# Patient Record
Sex: Male | Born: 1970 | Race: Asian | Hispanic: No | Marital: Married | Smoking: Never smoker
Health system: Southern US, Community
[De-identification: ages and names within clinical notes are randomized; demographics above are authoritative.]

## PROBLEM LIST (undated history)

## (undated) DIAGNOSIS — E119 Type 2 diabetes mellitus without complications: Secondary | ICD-10-CM

## (undated) DIAGNOSIS — E785 Hyperlipidemia, unspecified: Secondary | ICD-10-CM

## (undated) DIAGNOSIS — I209 Angina pectoris, unspecified: Secondary | ICD-10-CM

## (undated) DIAGNOSIS — I2510 Atherosclerotic heart disease of native coronary artery without angina pectoris: Secondary | ICD-10-CM

## (undated) DIAGNOSIS — I10 Essential (primary) hypertension: Secondary | ICD-10-CM

## (undated) HISTORY — DX: Atherosclerotic heart disease of native coronary artery without angina pectoris: I25.10

## (undated) HISTORY — PX: OTHER SURGICAL HISTORY: SHX169

---

## 2018-01-22 ENCOUNTER — Other Ambulatory Visit: Payer: Self-pay | Admitting: Cardiology

## 2018-01-22 DIAGNOSIS — I209 Angina pectoris, unspecified: Secondary | ICD-10-CM

## 2018-01-31 ENCOUNTER — Ambulatory Visit (HOSPITAL_COMMUNITY)
Admission: RE | Admit: 2018-01-31 | Discharge: 2018-01-31 | Disposition: A | Discharge status: Home / Self Care | Payer: Managed Care, Other (non HMO) | Source: Ambulatory Visit | Attending: Cardiology | Admitting: Cardiology

## 2018-01-31 ENCOUNTER — Encounter: Payer: Managed Care, Other (non HMO) | Admitting: *Deleted

## 2018-01-31 ENCOUNTER — Encounter (HOSPITAL_COMMUNITY): Payer: Self-pay

## 2018-01-31 DIAGNOSIS — I209 Angina pectoris, unspecified: Secondary | ICD-10-CM | POA: Diagnosis present

## 2018-01-31 DIAGNOSIS — K760 Fatty (change of) liver, not elsewhere classified: Secondary | ICD-10-CM | POA: Diagnosis not present

## 2018-01-31 DIAGNOSIS — I2510 Atherosclerotic heart disease of native coronary artery without angina pectoris: Secondary | ICD-10-CM

## 2018-01-31 DIAGNOSIS — I25119 Atherosclerotic heart disease of native coronary artery with unspecified angina pectoris: Secondary | ICD-10-CM | POA: Insufficient documentation

## 2018-01-31 DIAGNOSIS — Z006 Encounter for examination for normal comparison and control in clinical research program: Secondary | ICD-10-CM

## 2018-01-31 DIAGNOSIS — R079 Chest pain, unspecified: Secondary | ICD-10-CM

## 2018-01-31 HISTORY — DX: Angina pectoris, unspecified: I20.9

## 2018-01-31 HISTORY — DX: Hyperlipidemia, unspecified: E78.5

## 2018-01-31 HISTORY — DX: Essential (primary) hypertension: I10

## 2018-01-31 HISTORY — DX: Type 2 diabetes mellitus without complications: E11.9

## 2018-01-31 LAB — POCT I-STAT CREATININE: Creatinine, Ser: 0.7 mg/dL (ref 0.61–1.24)

## 2018-01-31 IMAGING — CT CT HEART MORP W/ CTA COR W/ SCORE W/ CA W/CM &/OR W/O CM
4 of 7 series · 8 of 20 positions shown, 9 images · IV contrast (APPLIED)
Comparison: None.

Addendum:
EXAM:
OVER-READ INTERPRETATION  CT CHEST

The following report is an over-read performed by radiologist Dr.
Beatrix Right [REDACTED] on 01/31/2018. This
over-read does not include interpretation of cardiac or coronary
anatomy or pathology. The coronary calcium score/coronary CTA
interpretation by the cardiologist is attached.
CLINICAL DATA: Chest pain
Cardiac CTA
MEDICATIONS:
Sub lingual nitro. 4mg x 2
TECHNIQUE: The patient was scanned on a Siemens [REDACTED]ice scanner. Gantry
rotation speed was 250 msecs. Collimation was 0.6 mm. A 100 kV
prospective scan was triggered in the ascending thoracic aorta at
35-75% of the R-R interval. Average HR during the scan was 60 bpm.
The 3D data set was interpreted on a dedicated work station using
MPR, MIP and VRT modes. A total of 80cc of contrast was used.

[Series 6: best diast 75 % · axial · 0.37mm/px · z∈[+1112,+1158]mm · 2 of 346 slices shown, 3 images]
[im 116/346  vessel]
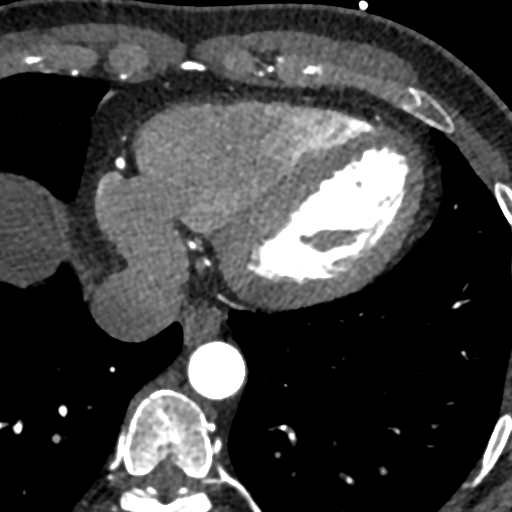
[im 116/346  lung]
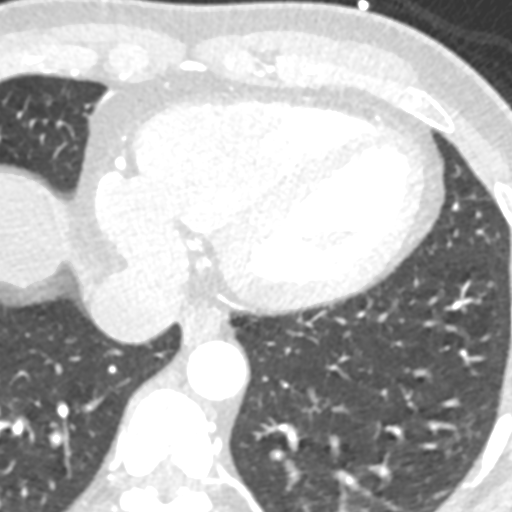
[im 231/346  vessel]
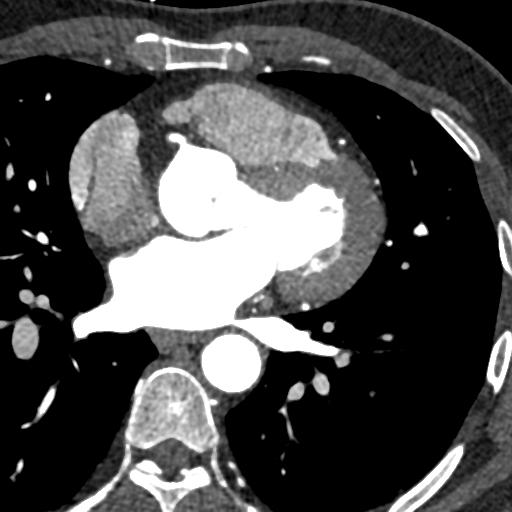

[Series 7: best syst 32 % · axial · 0.37mm/px · z∈[+1112,+1158]mm · 2 of 346 slices shown]
[im 116/346  vessel]
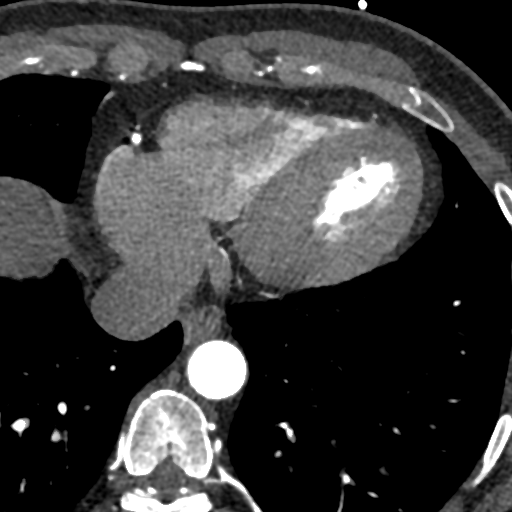
[im 231/346  vessel]
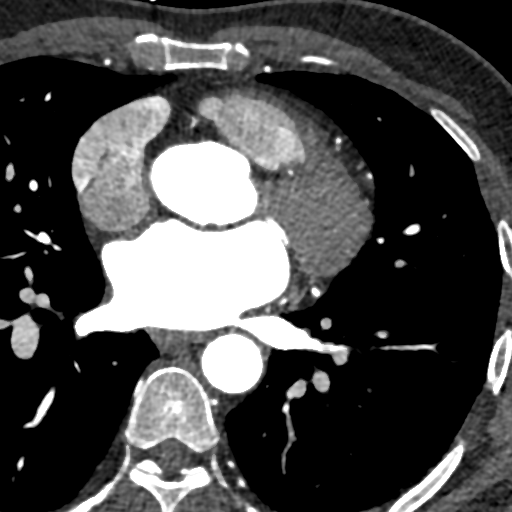

[Series 8: ts diast sharp 75 % · axial · 0.37mm/px · z∈[+1112,+1158]mm · 2 of 346 slices shown]
[im 116/346  lung]
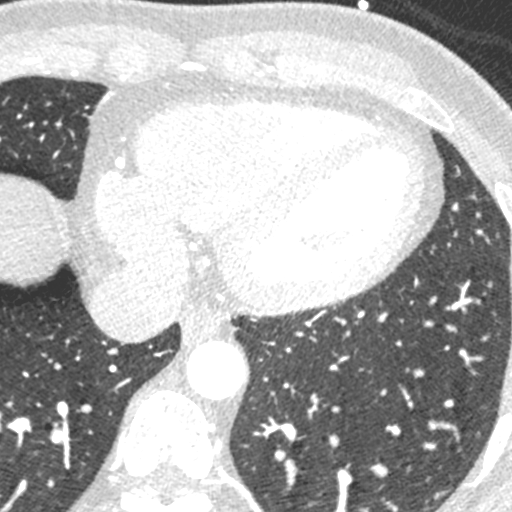
[im 231/346  lung]
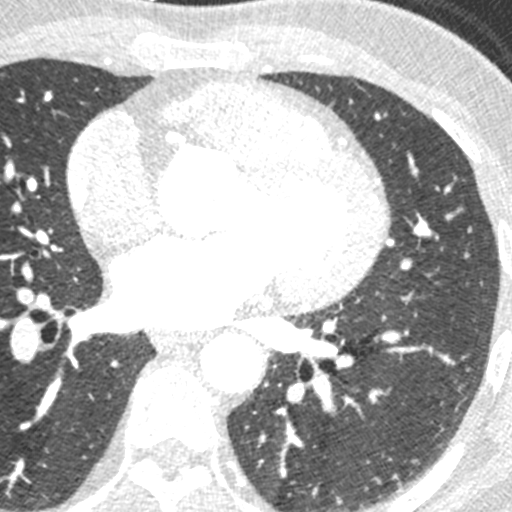

[Series 9: ts syst sharp 32 % · axial · 0.37mm/px · z∈[+1112,+1158]mm · 2 of 346 slices shown]
[im 116/346  lung]
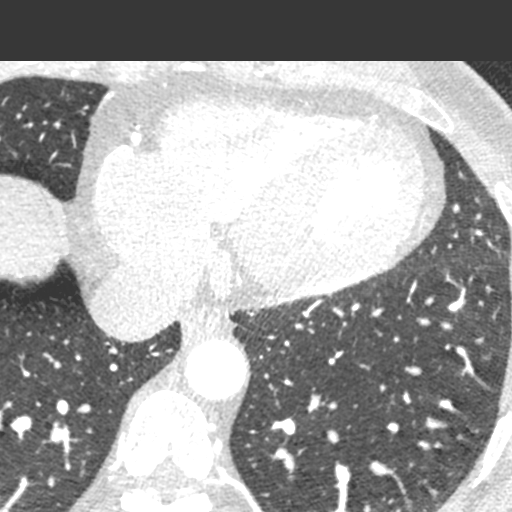
[im 231/346  lung]
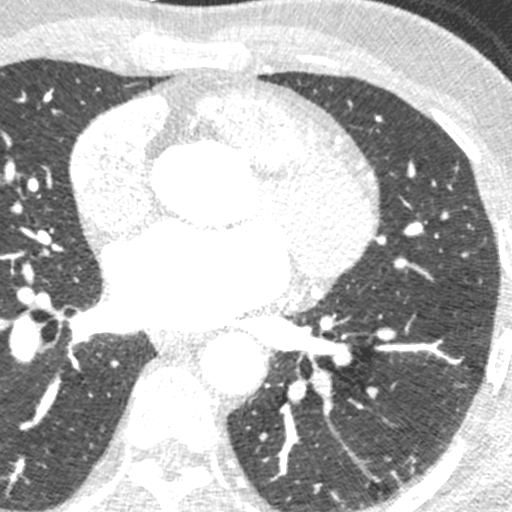

[8 of 20 positions shown; findings below may reference images not displayed]

FINDINGS: Within the visualized portions of the thorax there are no suspicious
appearing pulmonary nodules or masses, there is no acute
consolidative airspace disease, no pleural effusions, no
pneumothorax and no lymphadenopathy. Visualized portions of the
upper abdomen demonstrates diffuse low attenuation throughout the
visualized hepatic parenchyma, indicative of hepatic steatosis.
There are no aggressive appearing lytic or blastic lesions noted in
the visualized portions of the skeleton.
IMPRESSION: 1. Hepatic steatosis.
FINDINGS: Non-cardiac: See separate report from [REDACTED].

Pulmonary veins drained normally to the left atrium.

Calcium Score: 13.2 Agatston units.

Coronary Arteries: Right dominant with no anomalies

LM: No plaque or stenosis.

LAD system: There is mixed plaque with severe (>90%) stenosis in the
proximal LAD.

Circumflex system: No plaque or stenosis.

RCA system:  No plaque or stenosis.
IMPRESSION: 1. Coronary artery calcium score 13.2 Agatston units. This places
the patient in the 84th percentile for age and gender.

2. There appears to be severe (>90%) proximal LAD stenosis. Will
send FFR to confirm.

Sopha Yakar

*** End of Addendum ***

## 2018-01-31 MED ORDER — NITROGLYCERIN 0.4 MG SL SUBL
0.4000 mg | SUBLINGUAL_TABLET | SUBLINGUAL | Status: DC | PRN
  Administered 2018-01-31: 0.8 mg via SUBLINGUAL
  Filled 2018-01-31: qty 25

## 2018-01-31 MED ORDER — METOPROLOL TARTRATE 5 MG/5ML IV SOLN
5.0000 mg | INTRAVENOUS | Status: DC | PRN
  Administered 2018-01-31: 5 mg via INTRAVENOUS
  Filled 2018-01-31: qty 5

## 2018-01-31 MED ORDER — METOPROLOL TARTRATE 5 MG/5ML IV SOLN
INTRAVENOUS | Status: AC
  Administered 2018-01-31: 5 mg via INTRAVENOUS
  Filled 2018-01-31: qty 10

## 2018-01-31 MED ORDER — NITROGLYCERIN 0.4 MG SL SUBL
SUBLINGUAL_TABLET | SUBLINGUAL | Status: AC
  Administered 2018-01-31: 0.8 mg via SUBLINGUAL
  Filled 2018-01-31: qty 1

## 2018-01-31 MED ORDER — IOPAMIDOL (ISOVUE-370) INJECTION 76%
100.0000 mL | Freq: Once | INTRAVENOUS | Status: AC | PRN
  Administered 2018-01-31: 100 mL via INTRAVENOUS

## 2018-01-31 NOTE — Research (Signed)
CADFEM Informed Consent                  Subject Name:   Peter Warren   Subject met inclusion and exclusion criteria.  The informed consent form, study requirements and expectations were reviewed with the subject and questions and concerns were addressed prior to the signing of the consent form.  The subject verbalized understanding of the trial requirements.  The subject agreed to participate in the CADFEM trial and signed the informed consent.  The informed consent was obtained prior to performance of any protocol-specific procedures for the subject.  A copy of the signed informed consent was given to the subject and a copy was placed in the subject's medical record.   [place] Chalmers, Research Assistant 01/31/2018     11:48 a.m.

## 2018-02-03 ENCOUNTER — Ambulatory Visit (HOSPITAL_COMMUNITY)
Admission: RE | Admit: 2018-02-03 | Discharge: 2018-02-03 | Disposition: A | Discharge status: Home / Self Care | Payer: Managed Care, Other (non HMO) | Source: Ambulatory Visit | Attending: Cardiology | Admitting: Cardiology

## 2018-02-03 DIAGNOSIS — I2510 Atherosclerotic heart disease of native coronary artery without angina pectoris: Secondary | ICD-10-CM | POA: Diagnosis not present

## 2018-05-13 ENCOUNTER — Other Ambulatory Visit (HOSPITAL_COMMUNITY): Payer: Self-pay | Admitting: Cardiology

## 2018-05-14 LAB — LIPID PANEL W/O CHOL/HDL RATIO
Cholesterol, Total: 76 mg/dL — ABNORMAL LOW (ref 100–199)
HDL: 26 mg/dL — ABNORMAL LOW (ref >39)
LDL Calculated: 27 mg/dL (ref 0–99)
Triglycerides: 113 mg/dL (ref 0–149)
VLDL Cholesterol Cal: 23 mg/dL (ref 5–40)

## 2018-05-14 LAB — HGB A1C W/O EAG: Hgb A1c MFr Bld: 5.3 % (ref 4.8–5.6)

## 2018-05-14 LAB — COMPREHENSIVE METABOLIC PANEL
ALBUMIN: 5 g/dL (ref 4.0–5.0)
ALT: 32 IU/L (ref 0–44)
AST: 26 IU/L (ref 0–40)
Albumin/Globulin Ratio: 2.4 — ABNORMAL HIGH (ref 1.2–2.2)
Alkaline Phosphatase: 89 IU/L (ref 39–117)
BILIRUBIN TOTAL: 1 mg/dL (ref 0.0–1.2)
BUN / CREAT RATIO: 11 (ref 9–20)
BUN: 9 mg/dL (ref 6–24)
CALCIUM: 9.2 mg/dL (ref 8.7–10.2)
CHLORIDE: 105 mmol/L (ref 96–106)
CO2: 22 mmol/L (ref 20–29)
Creatinine, Ser: 0.82 mg/dL (ref 0.76–1.27)
GFR calc non Af Amer: 107 mL/min/{1.73_m2} (ref >59)
GFR, EST AFRICAN AMERICAN: 123 mL/min/{1.73_m2} (ref >59)
GLUCOSE: 93 mg/dL (ref 65–99)
Globulin, Total: 2.1 g/dL (ref 1.5–4.5)
Potassium: 4.3 mmol/L (ref 3.5–5.2)
Sodium: 142 mmol/L (ref 134–144)
TOTAL PROTEIN: 7.1 g/dL (ref 6.0–8.5)

## 2018-05-16 ENCOUNTER — Ambulatory Visit (INDEPENDENT_AMBULATORY_CARE_PROVIDER_SITE_OTHER): Payer: Managed Care, Other (non HMO) | Admitting: Cardiology

## 2018-05-16 ENCOUNTER — Encounter: Payer: Self-pay | Admitting: Cardiology

## 2018-05-16 ENCOUNTER — Encounter (INDEPENDENT_AMBULATORY_CARE_PROVIDER_SITE_OTHER): Payer: Self-pay

## 2018-05-16 VITALS — BP 102/70 | HR 68 | Ht 66.0 in | Wt 134.9 lb

## 2018-05-16 DIAGNOSIS — E119 Type 2 diabetes mellitus without complications: Secondary | ICD-10-CM | POA: Diagnosis not present

## 2018-05-16 DIAGNOSIS — E782 Mixed hyperlipidemia: Secondary | ICD-10-CM

## 2018-05-16 DIAGNOSIS — I2510 Atherosclerotic heart disease of native coronary artery without angina pectoris: Secondary | ICD-10-CM

## 2018-05-16 MED ORDER — JARDIANCE 10 MG PO TABS: 5.0000 mg | ORAL_TABLET | Freq: Every day | ORAL | 3 refills | Status: DC

## 2018-05-16 NOTE — Progress Notes (Signed)
Subjective:   Peter Warren, male    DOB: 1970/09/01, 48 y.o.   MRN: [id number]   Chief Complaint  Patient presents with  . Hyperlipidemia    2 momnth f/u labs    HPI  48 year old Asian [place] man with type II diabetes mellitus, hypertriglyceridemia, single vessel coronary artery disease with ostial/prox LAD > 90% stenosis, h/o elevated liver enzymes here for follow-up.  After discussing the risks/benefits/ and alternative options of invasive therapy with stenting + optimal medical therapy versus optimal medical therapy alone, patient had opted for medical therapy alone.  Patient has made significant changes to his diet and lifestyle in the past 3 months. He has been on optimal medical therapy. His TG have dropped by >60%. HbA1C has decreased from 8.3% to 5.3%.  Liver enzymes have normalized.  He remains cautious with his physical activity, but is walking up to 35 minutes at a slow speed without any chest pain or shortness of breath symptoms.  He has also started taking stairs at work without any symptoms.   Results for Peter Warren (MRN [id number]) as of 05/16/2018 08:50  Ref. Range 05/13/2018 08:47  COMPREHENSIVE METABOLIC PANEL Unknown Rpt (A)  Sodium Latest Ref Range: 134 - 144 mmol/L 142  Potassium Latest Ref Range: 3.5 - 5.2 mmol/L 4.3  Chloride Latest Ref Range: 96 - 106 mmol/L 105  CO2 Latest Ref Range: 20 - 29 mmol/L 22  Glucose Latest Ref Range: 65 - 99 mg/dL 93  BUN Latest Ref Range: 6 - 24 mg/dL 9  Creatinine Latest Ref Range: 0.76 - 1.27 mg/dL 7.82  Calcium Latest Ref Range: 8.7 - 10.2 mg/dL 9.2  BUN/Creatinine Ratio Latest Ref Range: 9 - 20  11  Alkaline Phosphatase Latest Ref Range: 39 - 117 IU/L 89  Albumin Latest Ref Range: 4.0 - 5.0 g/dL 5.0  Albumin/Globulin Ratio Latest Ref Range: 1.2 - 2.2  2.4 (H)  AST Latest Ref Range: 0 - 40 IU/L 26  ALT Latest Ref Range: 0 - 44 IU/L 32  Total Protein Latest Ref Range: 6.0 - 8.5 g/dL 7.1  Total Bilirubin Latest Ref  Range: 0.0 - 1.2 mg/dL 1.0  GFR, Est Non African American Latest Ref Range: >59 mL/min/1.73 107  GFR, Est African American Latest Ref Range: >59 mL/min/1.73 123  Cholesterol, Total Latest Ref Range: 100 - 199 mg/dL 76 (L)  HDL Cholesterol Latest Ref Range: >39 mg/dL 26 (L)  LDL (calc) Latest Ref Range: 0 - 99 mg/dL 27  Triglycerides Latest Ref Range: 0 - 149 mg/dL 956  VLDL Cholesterol Cal Latest Ref Range: 5 - 40 mg/dL 23  Globulin, Total Latest Ref Range: 1.5 - 4.5 g/dL 2.1   Results for Peter Warren (MRN [id number]) as of 05/16/2018 08:50  Ref. Range 05/13/2018 08:47  Glucose Latest Ref Range: 65 - 99 mg/dL 93  Hemoglobin I6N Latest Ref Range: 4.8 - 5.6 % 5.3    Past Medical History:  Diagnosis Date  . Angina pectoris (HCC)   . Coronary artery disease   . Diabetes type 2, controlled (HCC)   . Hyperlipidemia   . Hypertension      History reviewed. No pertinent surgical history.   Social History   Socioeconomic History  . Marital status: Married    Spouse name: Not on file  . Number of children: Not on file  . Years of education: Not on file  . Highest education level: Not on file  Occupational History  . Not on file  Social  Needs  . Financial resource strain: Not on file  . Food insecurity:    Worry: Not on file    Inability: Not on file  . Transportation needs:    Medical: Not on file    Non-medical: Not on file  Tobacco Use  . Smoking status: Never Smoker  . Smokeless tobacco: Never Used  Substance and Sexual Activity  . Alcohol use: Not on file  . Drug use: Not on file  . Sexual activity: Not on file  Lifestyle  . Physical activity:    Days per week: Not on file    Minutes per session: Not on file  . Stress: Not on file  Relationships  . Social connections:    Talks on phone: Not on file    Gets together: Not on file    Attends religious service: Not on file    Active member of club or organization: Not on file    Attends meetings of clubs or  organizations: Not on file    Relationship status: Not on file  . Intimate partner violence:    Fear of current or ex partner: Not on file    Emotionally abused: Not on file    Physically abused: Not on file    Forced sexual activity: Not on file  Other Topics Concern  . Not on file  Social History Narrative  . Not on file     Current Outpatient Medications on File Prior to Visit  Medication Sig Dispense Refill  . aspirin 81 MG chewable tablet Chew 81 mg by mouth daily.    . isosorbide mononitrate (IMDUR) 30 MG 24 hr tablet Take 30 mg by mouth daily.    . metFORMIN (GLUCOPHAGE) 500 MG tablet Take 1 tablet by mouth 2 (two) times daily.    . nitroGLYCERIN (NITROSTAT) 0.4 MG SL tablet Place 0.4 mg under the tongue as needed.    . rosuvastatin (CRESTOR) 20 MG tablet Take 20 mg by mouth daily.    . TOPROL XL 25 MG 24 hr tablet Take 0.5 tablets by mouth daily.    Marland Kitchen VASCEPA 1 g CAPS Take 2 capsules by mouth 2 (two) times daily.     No current facility-administered medications on file prior to visit.     Cardiovascular studies:  CT FFR 01/31/2018: EXAM: CT FFR  MEDICATIONS: No additional medications.  TECHNIQUE: The coronary CTA was sent for FFR.  FINDINGS: The ostial LAD is modeled as occluded by CT FFR, but there is flow noted on the coronary CT angiogram.  IMPRESSION: Severe (95+%) ostial/proximal LAD stenosis.  Echocardiogram 02/07/2018: Left ventricle cavity is normal in size. Moderate concentric hypertrophy of the left ventricle. Mild decrease in global wall motion. Visual EF is 45-50%. Doppler evidence of grade I (impaired) diastolic dysfunction, normal LAP.  No hemodynamically significant valvular abnormality. Normal right atrial pressure.  Review of Systems  Constitution: Negative for decreased appetite, malaise/fatigue, weight gain and weight loss.  HENT: Negative for congestion.   Eyes: Negative for visual disturbance.  Cardiovascular: Negative for chest  pain, claudication, dyspnea on exertion, leg swelling, palpitations and syncope.  Respiratory: Negative for shortness of breath.   Endocrine: Negative for cold intolerance.  Hematologic/Lymphatic: Does not bruise/bleed easily.  Skin: Negative for itching and rash.  Musculoskeletal: Negative for myalgias.  Gastrointestinal: Negative for abdominal pain, nausea and vomiting.  Genitourinary: Negative for dysuria.  Neurological: Negative for dizziness and weakness.  Psychiatric/Behavioral: The patient is not nervous/anxious.   All other systems reviewed  and are negative.        Vitals:   05/16/18 1215  BP: 102/70  Pulse: 68  SpO2: 97%    Objective:   Physical Exam  Constitutional: He is oriented to person, place, and time. He appears well-developed and well-nourished. No distress.  HENT:  Head: Normocephalic and atraumatic.  Eyes: Pupils are equal, round, and reactive to light. Conjunctivae are normal.  Neck: No JVD present.  Cardiovascular: Normal rate, regular rhythm and intact distal pulses.  No murmur heard. Pulmonary/Chest: Effort normal and breath sounds normal. He has no wheezes. He has no rales.  Abdominal: Soft. Bowel sounds are normal. There is no rebound.  Musculoskeletal:        General: No edema.  Lymphadenopathy:    He has no cervical adenopathy.  Neurological: He is alert and oriented to person, place, and time. No cranial nerve deficit.  Skin: Skin is warm and dry.  Psychiatric: He has a normal mood and affect.  Nursing note and vitals reviewed.         Assessment & Recommendations:   48 year old Asian [place] man with type II diabetes mellitus, hypertriglyceridemia, single vessel coronary artery disease with ostial/prox LAD > 90% stenosis, h/o elevated liver enzymes here for follow-up.  1. Coronary artery disease involving native coronary artery of native heart without angina pectoris Proximal LAD 95% stenosis diagnosed on coronary CT angiogram.   Patient is on aggressive medical therapy without stenting, as per his wishes.  Fortunately, patient has done well since the diagnosis in 01/2018.  Currently, he has no anginal symptoms.  I suspect he has likely developed extensive collateralization, and thus does not have any anginal symptoms at this time.  Continue current medical therapy including aspirin, statin, low-dose metoprolol succinate and isosorbide mononitrate.  He has sublingual nitroglycerin for as needed use and knows to call 911 in the setting of chest pain not resolving with sublingual nitroglycerin.  He is also on excellent therapy for his diabetes mellitus and hyperlipidemia, as below.  2. Controlled type 2 diabetes mellitus without complication, without long-term current use of insulin (HCC) Hemoglobin A1c improved from 8.3% to 5.3% on low-dose metformin and Jardiance.  To avoid hypoglycemia, I have decreased his Jardiance to half tablet of 10 mg daily.  3. Mixed hypertriglyceridemia Greater than 60% decrease in triglycerides.  Continue the Vascepa 2 g twice daily.  4.  History of elevated liver enzymes: This has completely normalized in spite of statin use.  As previously stated, I have encouraged the patient to establish care with a primary care provider.  I will see him back in 3 months.   Elder Negus, MD [facility] Pager: [phone] Office: [phone] If no answer Cell [phone]

## 2018-05-17 ENCOUNTER — Encounter: Payer: Self-pay | Admitting: Cardiology

## 2018-05-17 DIAGNOSIS — E119 Type 2 diabetes mellitus without complications: Secondary | ICD-10-CM | POA: Insufficient documentation

## 2018-05-17 DIAGNOSIS — E118 Type 2 diabetes mellitus with unspecified complications: Secondary | ICD-10-CM | POA: Insufficient documentation

## 2018-05-17 DIAGNOSIS — E782 Mixed hyperlipidemia: Secondary | ICD-10-CM | POA: Insufficient documentation

## 2018-05-17 DIAGNOSIS — I25118 Atherosclerotic heart disease of native coronary artery with other forms of angina pectoris: Secondary | ICD-10-CM | POA: Insufficient documentation

## 2018-05-17 DIAGNOSIS — I2510 Atherosclerotic heart disease of native coronary artery without angina pectoris: Secondary | ICD-10-CM | POA: Insufficient documentation

## 2018-08-14 ENCOUNTER — Ambulatory Visit: Payer: Managed Care, Other (non HMO) | Admitting: Cardiology

## 2018-09-26 ENCOUNTER — Other Ambulatory Visit: Payer: Self-pay | Admitting: Cardiology

## 2018-10-17 ENCOUNTER — Ambulatory Visit: Payer: Managed Care, Other (non HMO) | Admitting: Cardiology

## 2018-10-31 ENCOUNTER — Encounter: Payer: Self-pay | Admitting: Cardiology

## 2018-11-02 NOTE — Progress Notes (Signed)
Subjective:   Peter MallickRavindra Warren, male    DOB: 12/11/1970, 48 y.o.   MRN: [id number]   Chief Complaint  Patient presents with  . Coronary Artery Disease  . Follow-up    3 month    HPI  48 year old Asian [place] man with type II diabetes mellitus, hypertriglyceridemia, single vessel coronary artery disease with ostial/prox LAD > 90% stenosis, h/o elevated liver enzymes here for follow-up.  After discussing the risks/benefits/ and alternative options of invasive therapy with stenting + optimal medical therapy versus optimal medical therapy alone, patient had opted for medical therapy alone.  Patient has done fairly well. His physical activity is limited to about 3000 steps/day, with which he does not have any angina symptoms. He has occasional retrosternal burning sensation when he climbs up stairs with heavy objects in his hands.    Results for Peter MallickKUMAR, Peter Warren (MRN [id number]) as of 05/16/2018 08:50  Ref. Range 05/13/2018 08:47  COMPREHENSIVE METABOLIC PANEL Unknown Rpt (A)  Sodium Latest Ref Range: 134 - 144 mmol/L 142  Potassium Latest Ref Range: 3.5 - 5.2 mmol/L 4.3  Chloride Latest Ref Range: 96 - 106 mmol/L 105  CO2 Latest Ref Range: 20 - 29 mmol/L 22  Glucose Latest Ref Range: 65 - 99 mg/dL 93  BUN Latest Ref Range: 6 - 24 mg/dL 9  Creatinine Latest Ref Range: 0.76 - 1.27 mg/dL 7.820.82  Calcium Latest Ref Range: 8.7 - 10.2 mg/dL 9.2  BUN/Creatinine Ratio Latest Ref Range: 9 - 20  11  Alkaline Phosphatase Latest Ref Range: 39 - 117 IU/L 89  Albumin Latest Ref Range: 4.0 - 5.0 g/dL 5.0  Albumin/Globulin Ratio Latest Ref Range: 1.2 - 2.2  2.4 (H)  AST Latest Ref Range: 0 - 40 IU/L 26  ALT Latest Ref Range: 0 - 44 IU/L 32  Total Protein Latest Ref Range: 6.0 - 8.5 g/dL 7.1  Total Bilirubin Latest Ref Range: 0.0 - 1.2 mg/dL 1.0  GFR, Est Non African American Latest Ref Range: >59 mL/min/1.73 107  GFR, Est African American Latest Ref Range: >59 mL/min/1.73 123  Cholesterol, Total  Latest Ref Range: 100 - 199 mg/dL 76 (L)  HDL Cholesterol Latest Ref Range: >39 mg/dL 26 (L)  LDL (calc) Latest Ref Range: 0 - 99 mg/dL 27  Triglycerides Latest Ref Range: 0 - 149 mg/dL 956113  VLDL Cholesterol Cal Latest Ref Range: 5 - 40 mg/dL 23  Globulin, Total Latest Ref Range: 1.5 - 4.5 g/dL 2.1   Results for Peter MallickKUMAR, Peter Warren (MRN [id number]) as of 05/16/2018 08:50  Ref. Range 05/13/2018 08:47  Glucose Latest Ref Range: 65 - 99 mg/dL 93  Hemoglobin I6NA1C Latest Ref Range: 4.8 - 5.6 % 5.3    Past Medical History:  Diagnosis Date  . Angina pectoris (HCC)   . Coronary artery disease   . Diabetes type 2, controlled (HCC)   . Hyperlipidemia   . Hypertension      Past Surgical History:  Procedure Laterality Date  . None       Social History   Socioeconomic History  . Marital status: Married    Spouse name: Not on file  . Number of children: 2  . Years of education: Not on file  . Highest education level: Not on file  Occupational History  . Not on file  Social Needs  . Financial resource strain: Not on file  . Food insecurity    Worry: Not on file    Inability: Not on file  . Transportation  needs    Medical: Not on file    Non-medical: Not on file  Tobacco Use  . Smoking status: Never Smoker  . Smokeless tobacco: Never Used  Substance and Sexual Activity  . Alcohol use: Never    Frequency: Never  . Drug use: Not on file  . Sexual activity: Not on file  Lifestyle  . Physical activity    Days per week: Not on file    Minutes per session: Not on file  . Stress: Not on file  Relationships  . Social Herbalist on phone: Not on file    Gets together: Not on file    Attends religious service: Not on file    Active member of club or organization: Not on file    Attends meetings of clubs or organizations: Not on file    Relationship status: Not on file  . Intimate partner violence    Fear of current or ex partner: Not on file    Emotionally abused: Not on  file    Physically abused: Not on file    Forced sexual activity: Not on file  Other Topics Concern  . Not on file  Social History Narrative  . Not on file     Current Outpatient Medications on File Prior to Visit  Medication Sig Dispense Refill  . rosuvastatin (CRESTOR) 20 MG tablet Take 20 mg by mouth daily.    Marland Kitchen aspirin 81 MG chewable tablet TAKE 1 TABLET DAILY (MAY CHEW) 90 tablet 3  . isosorbide mononitrate (IMDUR) 30 MG 24 hr tablet Take 30 mg by mouth daily.    Marland Kitchen JARDIANCE 10 MG TABS tablet Take 5 mg by mouth daily. 45 tablet 3  . metFORMIN (GLUCOPHAGE) 500 MG tablet Take 1 tablet by mouth 2 (two) times daily.    . nitroGLYCERIN (NITROSTAT) 0.4 MG SL tablet Place 0.4 mg under the tongue as needed.    . TOPROL XL 25 MG 24 hr tablet Take 0.5 tablets by mouth daily.    Marland Kitchen VASCEPA 1 g CAPS Take 2 capsules by mouth 2 (two) times daily.     No current facility-administered medications on file prior to visit.     Cardiovascular studies:  EKG 11/03/2018: Sinus rhythm 69 bpm. Nonspecific T-abnormality.   CT FFR 01/31/2018: EXAM: CT FFR  MEDICATIONS: No additional medications.  TECHNIQUE: The coronary CTA was sent for FFR.  FINDINGS: The ostial LAD is modeled as occluded by CT FFR, but there is flow noted on the coronary CT angiogram.  IMPRESSION: Severe (95+%) ostial/proximal LAD stenosis.  Echocardiogram 02/07/2018: Left ventricle cavity is normal in size. Moderate concentric hypertrophy of the left ventricle. Mild decrease in global wall motion. Visual EF is 45-50%. Doppler evidence of grade I (impaired) diastolic dysfunction, normal LAP.  No hemodynamically significant valvular abnormality. Normal right atrial pressure.  Review of Systems  Constitution: Negative for decreased appetite, malaise/fatigue, weight gain and weight loss.  HENT: Negative for congestion.   Eyes: Negative for visual disturbance.  Cardiovascular: Positive for chest pain (Occasional  angina, as per HPI). Negative for claudication, dyspnea on exertion, leg swelling, palpitations and syncope.  Respiratory: Negative for cough and shortness of breath.   Endocrine: Negative for cold intolerance.  Hematologic/Lymphatic: Does not bruise/bleed easily.  Skin: Negative for itching and rash.  Musculoskeletal: Negative for myalgias.  Gastrointestinal: Negative for abdominal pain, nausea and vomiting.  Genitourinary: Negative for dysuria.  Neurological: Negative for dizziness and weakness.  Psychiatric/Behavioral: The patient  is not nervous/anxious.   All other systems reviewed and are negative.        Vitals:   11/03/18 1601  BP: 120/80  Pulse: 76  SpO2: 99%    Objective:   Physical Exam  Constitutional: He is oriented to person, place, and time. He appears well-developed and well-nourished. No distress.  HENT:  Head: Normocephalic and atraumatic.  Eyes: Pupils are equal, round, and reactive to light. Conjunctivae are normal.  Neck: No JVD present.  Cardiovascular: Normal rate, regular rhythm and intact distal pulses.  No murmur heard. Pulmonary/Chest: Effort normal and breath sounds normal. He has no wheezes. He has no rales.  Abdominal: Soft. Bowel sounds are normal. There is no rebound.  Musculoskeletal:        General: No edema.  Lymphadenopathy:    He has no cervical adenopathy.  Neurological: He is alert and oriented to person, place, and time. No cranial nerve deficit.  Skin: Skin is warm and dry.  Psychiatric: He has a normal mood and affect.  Nursing note and vitals reviewed.         Assessment & Recommendations:   48 year old Asian [place] man with type II diabetes mellitus, hypertriglyceridemia, single vessel coronary artery disease with ostial/prox LAD > 90% stenosis, h/o elevated liver enzymes here for follow-up.  1. Coronary artery disease involving native coronary artery of native heart without angina pectoris Proximal LAD 95% stenosis  diagnosed on coronary CT angiogram.  Patient is on aggressive medical therapy without stenting, as per his wishes.  Fortunately, patient has done well since the diagnosis in 01/2018.   Currently, he has very occasional angina symptoms. Continue current medical therapy including aspirin, statin, low-dose metoprolol succinate and isosorbide mononitrate.  He has sublingual nitroglycerin for as needed use and knows to call 911 in the setting of chest pain not resolving with sublingual nitroglycerin.  He is also on excellent therapy for his diabetes mellitus and hyperlipidemia, as below.  2. Controlled type 2 diabetes mellitus without complication, without long-term current use of insulin (HCC) Continue low-dose metformin and Jardiance.  ] Check HbA1C  3. Mixed hypertriglyceridemia Greater than 60% decrease in triglycerides. May reduce Vascepa to 1 g twice daily. Check lipid panel.  4.  History of elevated liver enzymes: This has completely normalized in spite of statin use. Check CMP.  As previously stated, I have encouraged the patient to establish care with a primary care provider.  I will see him back in 6 months.   Elder NegusManish J Nena Hampe, MD [facility] Pager: [phone] Office: [phone] If no answer Cell [phone]

## 2018-11-03 ENCOUNTER — Ambulatory Visit (INDEPENDENT_AMBULATORY_CARE_PROVIDER_SITE_OTHER): Payer: 59 | Admitting: Cardiology

## 2018-11-03 ENCOUNTER — Other Ambulatory Visit: Payer: Self-pay

## 2018-11-03 ENCOUNTER — Encounter: Payer: Self-pay | Admitting: Cardiology

## 2018-11-03 VITALS — BP 120/80 | HR 76 | Ht 66.0 in | Wt 137.2 lb

## 2018-11-03 DIAGNOSIS — I25118 Atherosclerotic heart disease of native coronary artery with other forms of angina pectoris: Secondary | ICD-10-CM | POA: Diagnosis not present

## 2018-11-03 DIAGNOSIS — E782 Mixed hyperlipidemia: Secondary | ICD-10-CM | POA: Diagnosis not present

## 2018-11-03 DIAGNOSIS — E119 Type 2 diabetes mellitus without complications: Secondary | ICD-10-CM | POA: Diagnosis not present

## 2018-11-03 MED ORDER — NITROGLYCERIN 0.4 MG SL SUBL: 0.4000 mg | SUBLINGUAL_TABLET | SUBLINGUAL | 3 refills | Status: DC | PRN

## 2018-12-16 ENCOUNTER — Other Ambulatory Visit: Payer: Self-pay | Admitting: Cardiology

## 2018-12-16 DIAGNOSIS — R17 Unspecified jaundice: Secondary | ICD-10-CM

## 2018-12-16 LAB — COMPREHENSIVE METABOLIC PANEL
ALT: 25 IU/L (ref 0–44)
AST: 22 IU/L (ref 0–40)
Albumin/Globulin Ratio: 2.5 — ABNORMAL HIGH (ref 1.2–2.2)
Albumin: 5 g/dL (ref 4.0–5.0)
Alkaline Phosphatase: 58 IU/L (ref 39–117)
BUN/Creatinine Ratio: 11 (ref 9–20)
BUN: 9 mg/dL (ref 6–24)
Bilirubin Total: 2 mg/dL — ABNORMAL HIGH (ref 0.0–1.2)
CO2: 26 mmol/L (ref 20–29)
Calcium: 9.4 mg/dL (ref 8.7–10.2)
Chloride: 101 mmol/L (ref 96–106)
Creatinine, Ser: 0.84 mg/dL (ref 0.76–1.27)
GFR calc Af Amer: 122 mL/min/{1.73_m2} (ref >59)
GFR calc non Af Amer: 106 mL/min/{1.73_m2} (ref >59)
Globulin, Total: 2 g/dL (ref 1.5–4.5)
Glucose: 98 mg/dL (ref 65–99)
Potassium: 4.1 mmol/L (ref 3.5–5.2)
Sodium: 139 mmol/L (ref 134–144)
Total Protein: 7 g/dL (ref 6.0–8.5)

## 2018-12-16 LAB — HEMOGLOBIN A1C
Est. average glucose Bld gHb Est-mCnc: 103 mg/dL
Hgb A1c MFr Bld: 5.2 % (ref 4.8–5.6)

## 2018-12-16 LAB — LIPID PANEL
Chol/HDL Ratio: 2.8 ratio (ref 0.0–5.0)
Cholesterol, Total: 94 mg/dL — ABNORMAL LOW (ref 100–199)
HDL: 34 mg/dL — ABNORMAL LOW (ref >39)
LDL Chol Calc (NIH): 38 mg/dL (ref 0–99)
Triglycerides: 125 mg/dL (ref 0–149)
VLDL Cholesterol Cal: 22 mg/dL (ref 5–40)

## 2018-12-17 NOTE — Progress Notes (Signed)
S/w pt advised him of above. He will repeat labs in 2 months

## 2018-12-26 ENCOUNTER — Other Ambulatory Visit: Payer: Self-pay

## 2019-01-18 ENCOUNTER — Other Ambulatory Visit: Payer: Self-pay | Admitting: Cardiology

## 2019-03-17 ENCOUNTER — Other Ambulatory Visit (HOSPITAL_COMMUNITY): Payer: Self-pay | Admitting: Cardiology

## 2019-03-18 LAB — COMPREHENSIVE METABOLIC PANEL
ALT: 22 IU/L (ref 0–44)
AST: 19 IU/L (ref 0–40)
Albumin/Globulin Ratio: 2.2 (ref 1.2–2.2)
Albumin: 4.8 g/dL (ref 4.0–5.0)
Alkaline Phosphatase: 54 IU/L (ref 39–117)
BUN/Creatinine Ratio: 11 (ref 9–20)
BUN: 10 mg/dL (ref 6–24)
Bilirubin Total: 1.6 mg/dL — ABNORMAL HIGH (ref 0.0–1.2)
CO2: 24 mmol/L (ref 20–29)
Calcium: 9.4 mg/dL (ref 8.7–10.2)
Chloride: 100 mmol/L (ref 96–106)
Creatinine, Ser: 0.87 mg/dL (ref 0.76–1.27)
GFR calc Af Amer: 120 mL/min/{1.73_m2} (ref >59)
GFR calc non Af Amer: 103 mL/min/{1.73_m2} (ref >59)
Globulin, Total: 2.2 g/dL (ref 1.5–4.5)
Glucose: 106 mg/dL — ABNORMAL HIGH (ref 65–99)
Potassium: 4.2 mmol/L (ref 3.5–5.2)
Sodium: 140 mmol/L (ref 134–144)
Total Protein: 7 g/dL (ref 6.0–8.5)

## 2019-04-10 ENCOUNTER — Other Ambulatory Visit: Payer: Self-pay | Admitting: Cardiology

## 2019-04-10 MED ORDER — METFORMIN HCL 500 MG PO TABS: 500.0000 mg | ORAL_TABLET | Freq: Two times a day (BID) | ORAL | 3 refills | Status: DC

## 2019-04-11 ENCOUNTER — Other Ambulatory Visit: Payer: Self-pay | Admitting: Cardiology

## 2019-04-22 ENCOUNTER — Other Ambulatory Visit: Payer: Self-pay

## 2019-04-22 MED ORDER — TOPROL XL 25 MG PO TB24: 12.5000 mg | ORAL_TABLET | Freq: Every day | ORAL | 1 refills | Status: DC

## 2019-04-29 ENCOUNTER — Other Ambulatory Visit: Payer: Self-pay

## 2019-04-29 MED ORDER — TOPROL XL 25 MG PO TB24: 12.5000 mg | ORAL_TABLET | Freq: Every day | ORAL | 3 refills | Status: DC

## 2019-05-08 ENCOUNTER — Ambulatory Visit: Payer: Managed Care, Other (non HMO) | Admitting: Cardiology

## 2019-07-13 ENCOUNTER — Ambulatory Visit: Payer: 59 | Admitting: Cardiology

## 2019-08-14 NOTE — Progress Notes (Signed)
Error

## 2019-08-19 ENCOUNTER — Other Ambulatory Visit: Payer: Self-pay

## 2019-08-19 ENCOUNTER — Ambulatory Visit: Payer: Self-pay | Admitting: Cardiology

## 2019-08-19 ENCOUNTER — Encounter: Payer: Self-pay | Admitting: Cardiology

## 2019-08-19 ENCOUNTER — Ambulatory Visit: Payer: 59 | Admitting: Cardiology

## 2019-08-19 VITALS — BP 117/79 | HR 61 | Resp 17 | Ht 66.0 in | Wt 139.0 lb

## 2019-08-19 DIAGNOSIS — I2510 Atherosclerotic heart disease of native coronary artery without angina pectoris: Secondary | ICD-10-CM

## 2019-08-19 DIAGNOSIS — E119 Type 2 diabetes mellitus without complications: Secondary | ICD-10-CM

## 2019-08-19 DIAGNOSIS — E782 Mixed hyperlipidemia: Secondary | ICD-10-CM

## 2019-08-19 NOTE — Progress Notes (Signed)
Follow up visit  Subjective:   Peter Warren, male    DOB: March 22, 1970, 49 y.o.   MRN: [id number]   HPI  Chief Complaint  Patient presents with   Coronary Artery Disease   Follow-up    4 month   Results    lab    49 year old Peter Warren man with type II diabetes mellitus, hypertriglyceridemia, single vessel coronary artery disease with ostial/prox LAD > 90% stenosis  Patient is doing well and denies chest pain, shortness of breath, palpitations, leg edema, orthopnea, PND, TIA/syncope. He recently had blood check at [facility]. He states his TG was around 300.    Current Outpatient Medications on File Prior to Visit  Medication Sig Dispense Refill   aspirin 81 MG chewable tablet TAKE 1 TABLET DAILY (MAY CHEW) 90 tablet 3   icosapent Ethyl (VASCEPA) 1 g capsule Take 2 capsules by mouth twice daily 120 capsule 6   isosorbide mononitrate (IMDUR) 30 MG 24 hr tablet Take 30 mg by mouth daily.     JARDIANCE 10 MG TABS tablet Take 5 mg by mouth daily. 45 tablet 3   nitroGLYCERIN (NITROSTAT) 0.4 MG SL tablet Place 1 tablet (0.4 mg total) under the tongue every 5 (five) minutes as needed for chest pain. 90 tablet 3   TOPROL XL 25 MG 24 hr tablet Take 0.5 tablets (12.5 mg total) by mouth daily. 90 tablet 3   No current facility-administered medications on file prior to visit.    Cardiovascular & other pertient studies:  EKG 08/19/2019: Sinus rhythm. Normal EKG.  CT FFR 01/31/2018: EXAM: CT FFR FINDINGS: The ostial LAD is modeled as occluded by CT FFR, but there is flow noted on the coronary CT angiogram. IMPRESSION: Severe (95+%) ostial/proximal LAD stenosis.  Echocardiogram 02/07/2018: Left ventricle cavity is normal in size. Moderate concentric hypertrophy of the left ventricle. Mild decrease in global wall motion. Visual EF is 45-50%. Doppler evidence of grade I (impaired) diastolic dysfunction, normal LAP.  No hemodynamically significant valvular  abnormality. Normal right atrial pressure.  Recent labs: 03/07/2019: Glucose 106, BUN/Cr 10/0.87. EGFR normal. Na/K 140/4.2. T.bili 1.6. Rest of the CMP normal HbA1C 5.2% Chol 94, TG 125, HDL 34, LDL 38   Review of Systems  Cardiovascular: Negative for chest pain, dyspnea on exertion, leg swelling, palpitations and syncope.         Vitals:   08/19/19 1420  BP: 117/79  Pulse: 61  Resp: 17  SpO2: 98%    Body mass index is 22.44 kg/m. Filed Weights   08/19/19 1420  Weight: 139 lb (63 kg)     Objective:   Physical Exam  Constitutional: No distress.  Neck: No JVD present.  Cardiovascular: Normal rate, regular rhythm, normal heart sounds and intact distal pulses.  No murmur heard. Pulmonary/Chest: Effort normal and breath sounds normal. He has no wheezes. He has no rales.  Musculoskeletal:        General: No edema.  Nursing note and vitals reviewed.         Assessment & Recommendations:   49 year old Peter Warren man with type II diabetes mellitus, hypertriglyceridemia, single vessel coronary artery disease with ostial/prox LAD > 90% stenosis  CAD: Proximal LAD 95% stenosis diagnosed on coronary CT angiogram. Patient is on aggressive medical therapy without revascularization, as per his wishes.Fortunately, patient has done well since the diagnosis in 01/2018.  No angina symptoms. Continue current medical and anti-anginal therapy.  Type 2 DM: A1C now down to 5.2%. Stop metformin. Continue  Jardiance, which he currently takes 5 mg daily.  Mixed hypertriglyceridemia: Check fasting lipid panel. Currently on Vascepa 1 g bid. If >200, increase to 2 g bid.  History of elevated liver enzymes: Resolved.  Lipid panel now and repeat in 6 months.  F/u in 6 months  Peter Warren [name], MD [facility] Pager: [phone] Office: [phone]

## 2019-08-19 NOTE — Progress Notes (Deleted)
Follow up visit  Subjective:   Peter Warren, male    DOB: Sep 21, 1970, 49 y.o.   MRN: [id number]    *** HPI  Chief Complaint  Patient presents with  . Coronary Artery Disease  . Follow-up    6 month  . Results    lab    49 y.o. *** male with ***  ***  *** Current Outpatient Medications on File Prior to Visit  Medication Sig Dispense Refill  . aspirin 81 MG chewable tablet TAKE 1 TABLET DAILY (MAY CHEW) 90 tablet 3  . icosapent Ethyl (VASCEPA) 1 g capsule Take 2 capsules by mouth twice daily 120 capsule 6  . isosorbide mononitrate (IMDUR) 30 MG 24 hr tablet Take 30 mg by mouth daily.    Marland Kitchen JARDIANCE 10 MG TABS tablet Take 5 mg by mouth daily. 45 tablet 3  . metFORMIN (GLUCOPHAGE) 500 MG tablet Take 1 tablet (500 mg total) by mouth 2 (two) times daily. (Patient taking differently: Take 500 mg by mouth daily with breakfast. ) 180 tablet 3  . nitroGLYCERIN (NITROSTAT) 0.4 MG SL tablet Place 1 tablet (0.4 mg total) under the tongue every 5 (five) minutes as needed for chest pain. 90 tablet 3  . TOPROL XL 25 MG 24 hr tablet Take 0.5 tablets (12.5 mg total) by mouth daily. 90 tablet 3   No current facility-administered medications on file prior to visit.    Cardiovascular & other pertient studies:  EKG 08/19/2019: Sinus rhythm 60 bpm Normal EKG  Recent labs: ***/***/202***: Glucose ***, BUN/Cr ***/***. EGFR ***. Na/K ***/***. ***Rest of the CMP normal H/H ***/***. MCV ***. Platelets *** ***HbA1C ***% Chol ***, TG ***, HDL ***, LDL *** ***TSH ***normal   *** ROS      *** Vitals:   08/19/19 1420  BP: 117/79  Pulse: 61  Resp: 17  SpO2: 98%    Body mass index is 22.44 kg/m. Filed Weights   08/19/19 1420  Weight: 139 lb (63 kg)    *** Objective:   Physical Exam        Assessment & Recommendations:   ***  ***   Tauheedah Bok [name], MD [place] Pager: [phone] Office: [phone]

## 2019-09-25 ENCOUNTER — Telehealth: Payer: Self-pay

## 2019-09-30 ENCOUNTER — Telehealth: Payer: Self-pay

## 2019-09-30 ENCOUNTER — Other Ambulatory Visit: Payer: Self-pay

## 2019-09-30 MED ORDER — ROSUVASTATIN CALCIUM 20 MG PO TABS: 20.0000 mg | ORAL_TABLET | Freq: Every day | ORAL | 3 refills | Status: DC

## 2019-09-30 MED ORDER — ASPIRIN 81 MG PO CHEW: 81.0000 mg | CHEWABLE_TABLET | Freq: Every day | ORAL | 3 refills | Status: DC

## 2019-09-30 NOTE — Telephone Encounter (Signed)
Yes. Okay to send it rosuvastatin 20 mg 90 pillsX3 refills. You may take Vascepa off the list, if he has not taking it.  Thanks MJP

## 2019-09-30 NOTE — Telephone Encounter (Signed)
Telephone encounter:  Reason for call: pt called for refill on rosuvastatin 20mg . It says it was d/c jan 2021 but he is taking it and not taking the vascepa.  Is it ok to send in ?  Usual provider: MP  Last office visit:08/19/19   Next office visit: 02/26/20   Last hospitalization: NA   Current Outpatient Medications on File Prior to Visit  Medication Sig Dispense Refill   aspirin 81 MG chewable tablet TAKE 1 TABLET DAILY (MAY CHEW) 90 tablet 3   icosapent Ethyl (VASCEPA) 1 g capsule Take 2 capsules by mouth twice daily 120 capsule 6   isosorbide mononitrate (IMDUR) 30 MG 24 hr tablet Take 30 mg by mouth daily.     JARDIANCE 10 MG TABS tablet Take 5 mg by mouth daily. 45 tablet 3   nitroGLYCERIN (NITROSTAT) 0.4 MG SL tablet Place 1 tablet (0.4 mg total) under the tongue every 5 (five) minutes as needed for chest pain. 90 tablet 3   TOPROL XL 25 MG 24 hr tablet Take 0.5 tablets (12.5 mg total) by mouth daily. 90 tablet 3   No current facility-administered medications on file prior to visit.

## 2019-12-02 NOTE — Telephone Encounter (Signed)
error 

## 2020-01-08 ENCOUNTER — Other Ambulatory Visit: Payer: Self-pay

## 2020-01-08 MED ORDER — ICOSAPENT ETHYL 1 G PO CAPS: 2.0000 g | ORAL_CAPSULE | Freq: Two times a day (BID) | ORAL | 6 refills | Status: DC

## 2020-01-08 MED ORDER — ASPIRIN 81 MG PO CHEW: 81.0000 mg | CHEWABLE_TABLET | Freq: Every day | ORAL | 3 refills | Status: DC

## 2020-01-08 MED ORDER — EMPAGLIFLOZIN 25 MG PO TABS: 25.0000 mg | ORAL_TABLET | Freq: Every day | ORAL | 1 refills | Status: DC

## 2020-01-09 ENCOUNTER — Other Ambulatory Visit: Payer: Self-pay | Admitting: Cardiology

## 2020-01-09 DIAGNOSIS — I25118 Atherosclerotic heart disease of native coronary artery with other forms of angina pectoris: Secondary | ICD-10-CM

## 2020-01-21 ENCOUNTER — Other Ambulatory Visit: Payer: Self-pay | Admitting: Cardiology

## 2020-02-26 ENCOUNTER — Ambulatory Visit: Payer: 59 | Admitting: Cardiology

## 2020-03-04 ENCOUNTER — Other Ambulatory Visit: Payer: Self-pay | Admitting: Cardiology

## 2020-03-04 DIAGNOSIS — I2510 Atherosclerotic heart disease of native coronary artery without angina pectoris: Secondary | ICD-10-CM

## 2020-03-04 DIAGNOSIS — E119 Type 2 diabetes mellitus without complications: Secondary | ICD-10-CM

## 2020-03-05 LAB — LIPID PANEL
Chol/HDL Ratio: 2.8 ratio (ref 0.0–5.0)
Cholesterol, Total: 102 mg/dL (ref 100–199)
HDL: 37 mg/dL — ABNORMAL LOW (ref >39)
LDL Chol Calc (NIH): 41 mg/dL (ref 0–99)
Triglycerides: 138 mg/dL (ref 0–149)
VLDL Cholesterol Cal: 24 mg/dL (ref 5–40)

## 2020-03-08 LAB — BASIC METABOLIC PANEL
BUN/Creatinine Ratio: 12 (ref 9–20)
BUN: 10 mg/dL (ref 6–24)
CO2: 23 mmol/L (ref 20–29)
Calcium: 9.4 mg/dL (ref 8.7–10.2)
Chloride: 101 mmol/L (ref 96–106)
Creatinine, Ser: 0.86 mg/dL (ref 0.76–1.27)
GFR calc Af Amer: 119 mL/min/{1.73_m2} (ref >59)
GFR calc non Af Amer: 103 mL/min/{1.73_m2} (ref >59)
Glucose: 109 mg/dL — ABNORMAL HIGH (ref 65–99)
Potassium: 4.3 mmol/L (ref 3.5–5.2)
Sodium: 140 mmol/L (ref 134–144)

## 2020-03-09 ENCOUNTER — Ambulatory Visit: Payer: 59 | Admitting: Cardiology

## 2020-03-09 ENCOUNTER — Encounter: Payer: Self-pay | Admitting: Cardiology

## 2020-03-09 ENCOUNTER — Other Ambulatory Visit: Payer: Self-pay

## 2020-03-09 VITALS — BP 111/76 | HR 83 | Resp 16 | Ht 66.0 in | Wt 136.0 lb

## 2020-03-09 DIAGNOSIS — I2510 Atherosclerotic heart disease of native coronary artery without angina pectoris: Secondary | ICD-10-CM

## 2020-03-09 DIAGNOSIS — E782 Mixed hyperlipidemia: Secondary | ICD-10-CM

## 2020-03-09 DIAGNOSIS — E119 Type 2 diabetes mellitus without complications: Secondary | ICD-10-CM

## 2020-03-09 MED ORDER — ROSUVASTATIN CALCIUM 20 MG PO TABS: 20.0000 mg | ORAL_TABLET | Freq: Every day | ORAL | 3 refills | Status: DC

## 2020-03-09 MED ORDER — TOPROL XL 25 MG PO TB24: 25.0000 mg | ORAL_TABLET | Freq: Every day | ORAL | 3 refills | Status: DC

## 2020-03-09 MED ORDER — NITROGLYCERIN 0.4 MG SL SUBL
0.4000 mg | SUBLINGUAL_TABLET | SUBLINGUAL | 1 refills | Status: DC | PRN
Start: 2020-03-09 — End: 2020-12-14

## 2020-03-09 MED ORDER — EMPAGLIFLOZIN 25 MG PO TABS: 25.0000 mg | ORAL_TABLET | Freq: Every day | ORAL | 1 refills | Status: DC

## 2020-03-09 MED ORDER — ICOSAPENT ETHYL 1 G PO CAPS: 2.0000 g | ORAL_CAPSULE | Freq: Two times a day (BID) | ORAL | 6 refills | Status: DC

## 2020-03-09 MED ORDER — ASPIRIN EC 81 MG PO TBEC: 81.0000 mg | DELAYED_RELEASE_TABLET | Freq: Every day | ORAL | 3 refills | Status: DC

## 2020-03-09 NOTE — Progress Notes (Signed)
Follow up visit  Subjective:   Peter Warren, male    DOB: May 08, 1970, 49 y.o.   MRN: [id number]   HPI  Chief Complaint  Patient presents with  . Coronary Artery Disease  . Follow-up    49 year old [place] man with type II diabetes mellitus, hypertriglyceridemia, single vessel coronary artery disease with ostial/prox LAD > 90% stenosis  Patient is doing well and denies chest pain, shortness of breath, palpitations, leg edema, orthopnea, PND, TIA/syncope. He wants to reduce the number of medications, if possible.   Current Outpatient Medications on File Prior to Visit  Medication Sig Dispense Refill  . aspirin 81 MG chewable tablet CHEW AND SWALLOW 1 TABLET BY MOUTH ONCE DAILY 30 tablet 11  . empagliflozin (JARDIANCE) 25 MG TABS tablet Take 1 tablet (25 mg total) by mouth daily. 90 tablet 1  . icosapent Ethyl (VASCEPA) 1 g capsule Take 2 capsules (2 g total) by mouth 2 (two) times daily. 120 capsule 6  . isosorbide mononitrate (IMDUR) 30 MG 24 hr tablet Take 30 mg by mouth daily.    . metFORMIN (GLUCOPHAGE) 500 MG tablet Take 500 mg by mouth 2 (two) times daily.    . nitroGLYCERIN (NITROSTAT) 0.4 MG SL tablet DISSOLVE ONE TABLET UNDER THE TONGUE EVERY 5 MINUTES AS NEEDED FOR CHEST PAIN.  DO NOT EXCEED A TOTAL OF 3 DOSES IN 15 MINUTES 25 tablet 0  . TOPROL XL 25 MG 24 hr tablet Take 0.5 tablets (12.5 mg total) by mouth daily. 90 tablet 3   No current facility-administered medications on file prior to visit.    Cardiovascular & other pertient studies:  EKG 03/09/2020: Sinus rhythm 71 bpm Nonspecific T-abnormality  CT FFR 01/31/2018: EXAM: CT FFR FINDINGS: The ostial LAD is modeled as occluded by CT FFR, but there is flow noted on the coronary CT angiogram. IMPRESSION: Severe (95+%) ostial/proximal LAD stenosis.  Echocardiogram 02/07/2018: Left ventricle cavity is normal in size. Moderate concentric hypertrophy of the left ventricle. Mild decrease in global  wall motion. Visual EF is 45-50%. Doppler evidence of grade I (impaired) diastolic dysfunction, normal LAP.  No hemodynamically significant valvular abnormality. Normal right atrial pressure.  Recent labs: 03/07/2020: Glucose 109, BUN/Cr 10/0.86. EGFR normal. Na/K 140/4.3.  Chol 102, TG 138, HDL 37, LDL 41   03/07/2019: Glucose 106, BUN/Cr 10/0.87. EGFR normal. Na/K 140/4.2. T.bili 1.6. Rest of the CMP normal HbA1C 5.2% Chol 94, TG 125, HDL 34, LDL 38   Review of Systems  Cardiovascular: Negative for chest pain, dyspnea on exertion, leg swelling, palpitations and syncope.         Vitals:   03/09/20 1517  BP: 111/76  Pulse: 83  Resp: 16  SpO2: 96%     Body mass index is 21.95 kg/m. Filed Weights   03/09/20 1517  Weight: 136 lb (61.7 kg)     Objective:   Physical Exam Vitals and nursing note reviewed.  Constitutional:      General: He is not in acute distress. Neck:     Vascular: No JVD.  Cardiovascular:     Rate and Rhythm: Normal rate and regular rhythm.     Pulses: Intact distal pulses.     Heart sounds: Normal heart sounds. No murmur heard.   Pulmonary:     Effort: Pulmonary effort is normal.     Breath sounds: Normal breath sounds. No wheezing or rales.           Assessment & Recommendations:   48 year old Asian  [place] man with type II diabetes mellitus, hypertriglyceridemia, single vessel coronary artery disease with ostial/prox LAD > 90% stenosis  CAD: Proximal LAD 95% stenosis diagnosed on coronary CT angiogram. Patient is on aggressive medical therapy without revascularization, as per his wishes.Fortunately, patient has done well since the diagnosis in 01/2018.  No angina symptoms.  Stop Imdur, instead increase metoprolol succinate to 25 mg daily..  Type 2 DM: A1C now down to 5.2%. Continue Jardiance 25 mg.  Mixed hypertriglyceridemia: Continue Vascepa 2 g bid.  Lipid panel now and repeat in 6 months.  F/u in 6  months  [name] [name], MD [facility] Pager: [phone] Office: [phone]

## 2020-03-14 ENCOUNTER — Other Ambulatory Visit: Payer: Self-pay | Admitting: Cardiology

## 2020-03-14 DIAGNOSIS — I2510 Atherosclerotic heart disease of native coronary artery without angina pectoris: Secondary | ICD-10-CM

## 2020-04-22 ENCOUNTER — Other Ambulatory Visit: Payer: Self-pay

## 2020-04-22 ENCOUNTER — Telehealth: Payer: Self-pay

## 2020-04-22 NOTE — Telephone Encounter (Signed)
This is what we decided at last visit.  Stop Imdur, instead increase metoprolol succinate to 25 mg daily.  Please ask him if anything has changed.  Thanks MJP

## 2020-04-22 NOTE — Telephone Encounter (Signed)
Pt called requesting a refill for his isosorbide mononitrate. However, I am not seeing it on his medication list. He wants to know if he is still suppose to be taking it.

## 2020-04-25 NOTE — Telephone Encounter (Signed)
Called pt to inform him about the message above

## 2020-06-21 ENCOUNTER — Other Ambulatory Visit: Payer: Self-pay

## 2020-06-21 DIAGNOSIS — I2510 Atherosclerotic heart disease of native coronary artery without angina pectoris: Secondary | ICD-10-CM

## 2020-06-21 MED ORDER — METOPROLOL SUCCINATE ER 25 MG PO TB24: 25.0000 mg | ORAL_TABLET | Freq: Every day | ORAL | 3 refills | Status: DC

## 2020-09-07 ENCOUNTER — Ambulatory Visit: Payer: 59 | Admitting: Cardiology

## 2020-11-11 ENCOUNTER — Ambulatory Visit: Payer: 59 | Admitting: Cardiology

## 2020-11-11 LAB — LIPID PANEL
Chol/HDL Ratio: 3.1 ratio (ref 0.0–5.0)
Cholesterol, Total: 109 mg/dL (ref 100–199)
HDL: 35 mg/dL — ABNORMAL LOW (ref >39)
LDL Chol Calc (NIH): 33 mg/dL (ref 0–99)
Triglycerides: 273 mg/dL — ABNORMAL HIGH (ref 0–149)
VLDL Cholesterol Cal: 41 mg/dL — ABNORMAL HIGH (ref 5–40)

## 2020-11-22 ENCOUNTER — Ambulatory Visit: Payer: Managed Care, Other (non HMO) | Admitting: Cardiology

## 2020-11-30 ENCOUNTER — Ambulatory Visit: Payer: Managed Care, Other (non HMO) | Admitting: Cardiology

## 2020-12-14 ENCOUNTER — Other Ambulatory Visit: Payer: Self-pay

## 2020-12-14 ENCOUNTER — Ambulatory Visit: Payer: Managed Care, Other (non HMO) | Admitting: Cardiology

## 2020-12-14 ENCOUNTER — Encounter: Payer: Self-pay | Admitting: Cardiology

## 2020-12-14 VITALS — BP 118/82 | HR 71 | Temp 97.7°F | Ht 66.0 in | Wt 140.0 lb

## 2020-12-14 DIAGNOSIS — E782 Mixed hyperlipidemia: Secondary | ICD-10-CM

## 2020-12-14 DIAGNOSIS — I2510 Atherosclerotic heart disease of native coronary artery without angina pectoris: Secondary | ICD-10-CM

## 2020-12-14 DIAGNOSIS — E119 Type 2 diabetes mellitus without complications: Secondary | ICD-10-CM

## 2020-12-14 MED ORDER — ICOSAPENT ETHYL 1 G PO CAPS: 2.0000 g | ORAL_CAPSULE | Freq: Two times a day (BID) | ORAL | 3 refills | Status: DC

## 2020-12-14 MED ORDER — NITROGLYCERIN 0.4 MG SL SUBL: 0.4000 mg | SUBLINGUAL_TABLET | SUBLINGUAL | 2 refills | Status: DC | PRN

## 2020-12-14 MED ORDER — METOPROLOL SUCCINATE ER 25 MG PO TB24: 25.0000 mg | ORAL_TABLET | Freq: Every day | ORAL | 3 refills | Status: DC

## 2020-12-14 MED ORDER — ROSUVASTATIN CALCIUM 20 MG PO TABS: 20.0000 mg | ORAL_TABLET | Freq: Every day | ORAL | 3 refills | Status: DC

## 2020-12-14 MED ORDER — ASPIRIN EC 81 MG PO TBEC: 81.0000 mg | DELAYED_RELEASE_TABLET | Freq: Every day | ORAL | 3 refills | Status: DC

## 2020-12-14 MED ORDER — EMPAGLIFLOZIN 25 MG PO TABS: 25.0000 mg | ORAL_TABLET | Freq: Every day | ORAL | 3 refills | Status: DC

## 2020-12-14 NOTE — Progress Notes (Signed)
Follow up visit  Subjective:   Peter Warren, male    DOB: 1970/07/17, 50 y.o.   MRN: [id number]   HPI  Chief Complaint  Patient presents with   Coronary Artery Disease   Follow-up    50 year old [place] man with type II diabetes mellitus, hypertriglyceridemia, single vessel coronary artery disease with ostial/prox LAD > 90% stenosis (CCTA 2019)   Patient is doing well and denies chest pain, shortness of breath, palpitations, leg edema, orthopnea, PND, TIA/syncope.  He walks 35 minutes every day without any symptoms.  He admits to dietary discretions over the past few months and attributes elevation in triglycerides to the same.  He is compliant with his medical therapy.  Wife asks if there is any benefit in repeating coronary CT angiogram at this time.  Current Outpatient Medications on File Prior to Visit  Medication Sig Dispense Refill   aspirin EC 81 MG tablet Take 1 tablet (81 mg total) by mouth daily. Swallow whole. 90 tablet 3   empagliflozin (JARDIANCE) 25 MG TABS tablet Take 1 tablet (25 mg total) by mouth daily. 90 tablet 1   icosapent Ethyl (VASCEPA) 1 g capsule Take 2 capsules (2 g total) by mouth 2 (two) times daily. 180 capsule 6   metFORMIN (GLUCOPHAGE) 500 MG tablet Take 500 mg by mouth daily with breakfast.     metoprolol succinate (TOPROL-XL) 25 MG 24 hr tablet Take 1 tablet (25 mg total) by mouth daily. 30 tablet 3   nitroGLYCERIN (NITROSTAT) 0.4 MG SL tablet Place 1 tablet (0.4 mg total) under the tongue every 5 (five) minutes as needed for chest pain. 25 tablet 1   rosuvastatin (CRESTOR) 20 MG tablet Take 1 tablet (20 mg total) by mouth daily. 90 tablet 3   No current facility-administered medications on file prior to visit.    Cardiovascular & other pertient studies:  EKG 12/14/2020: Sinus rhythm 74 bpm Normal EKG   CT FFR 01/31/2018: EXAM: CT FFR FINDINGS: The ostial LAD is modeled as occluded by CT FFR, but there is flow noted on the coronary  CT angiogram.  IMPRESSION: Severe (95+%) ostial/proximal LAD stenosis.   Echocardiogram 02/07/2018: Left ventricle cavity is normal in size. Moderate concentric hypertrophy of the left ventricle. Mild decrease in global wall motion. Visual EF is 45-50%. Doppler evidence of grade I (impaired) diastolic dysfunction, normal LAP.  No hemodynamically significant valvular abnormality. Normal right atrial pressure.   Recent labs: 11/10/2020: Chol 109, TG 273, HDL 35, LDL 33  03/07/2020: Glucose 109, BUN/Cr 10/0.86. EGFR normal. Na/K 140/4.3.  Chol 102, TG 138, HDL 37, LDL 41   03/07/2019: Glucose 106, BUN/Cr 10/0.87. EGFR normal. Na/K 140/4.2. T.bili 1.6. Rest of the CMP normal HbA1C 5.2% Chol 94, TG 125, HDL 34, LDL 38   Review of Systems  Cardiovascular:  Negative for chest pain, dyspnea on exertion, leg swelling, palpitations and syncope.        Vitals:   12/14/20 1128  BP: 118/82  Pulse: 71  Temp: 97.7 F (36.5 C)  SpO2: 100%     Body mass index is 22.6 kg/m. Filed Weights   12/14/20 1128  Weight: 140 lb (63.5 kg)     Objective:   Physical Exam Vitals and nursing note reviewed.  Constitutional:      General: He is not in acute distress. Neck:     Vascular: No JVD.  Cardiovascular:     Rate and Rhythm: Normal rate and regular rhythm.     Pulses:  Intact distal pulses.     Heart sounds: Normal heart sounds. No murmur heard. Pulmonary:     Effort: Pulmonary effort is normal.     Breath sounds: Normal breath sounds. No wheezing or rales.          Assessment & Recommendations:   50 year old [place] man with type II diabetes mellitus, hypertriglyceridemia, single vessel coronary artery disease with ostial/prox LAD > 90% stenosis   CAD: Proximal LAD 95% stenosis diagnosed on coronary CT angiogram (2019) Patient is on aggressive medical therapy without revascularization, as per his wishes. Fortunately, patient has done well since the diagnosis in  01/2018.   No angina symptoms.  Continue aspirin, statin, metoprolol, Jardiance, Vascepa.  I discussed with the patient and wife regarding management of coronary artery disease.  While he is known to have severe proximal LAD stenosis on prior CT angiogram, he is remained completely asymptomatic with medical management.  While I did recommend coronary angiogram and possible intervention when he was.  Most with obstructive CAD in 2019, and chose medical management at that time.  With his new carotid to his extensive doctor lifestyle modifications, and aggressive medical management, he is doing very well and has absolutely no anginal symptoms at this time.  Repeat coronary CT angiogram would not change management.  I recommend continued medical management which has helped him.  Patient denies high verbalized understanding.  Mixed hyperlipidemia: LDL well controlled.Triglycerides elevated at 273.  Discussed diet and lifestyle changes.  We will repeat fasting lipid panel in 3 months, along with CMP and A1c.  Type 2 DM: Continue Jardiance 25 mg. Recheck A1c in 3 months   F/u in 1 year  Nigel Mormon, MD [facility] Pager: [phone] Office: [phone]

## 2021-03-10 LAB — COMPREHENSIVE METABOLIC PANEL
ALT: 20 IU/L (ref 0–44)
AST: 24 IU/L (ref 0–40)
Albumin/Globulin Ratio: 2.3 — ABNORMAL HIGH (ref 1.2–2.2)
Albumin: 4.9 g/dL (ref 4.0–5.0)
Alkaline Phosphatase: 52 IU/L (ref 44–121)
BUN/Creatinine Ratio: 14 (ref 9–20)
BUN: 11 mg/dL (ref 6–24)
Bilirubin Total: 1.4 mg/dL — ABNORMAL HIGH (ref 0.0–1.2)
CO2: 26 mmol/L (ref 20–29)
Calcium: 9.3 mg/dL (ref 8.7–10.2)
Chloride: 101 mmol/L (ref 96–106)
Creatinine, Ser: 0.81 mg/dL (ref 0.76–1.27)
Globulin, Total: 2.1 g/dL (ref 1.5–4.5)
Glucose: 107 mg/dL — ABNORMAL HIGH (ref 70–99)
Potassium: 4.4 mmol/L (ref 3.5–5.2)
Sodium: 140 mmol/L (ref 134–144)
Total Protein: 7 g/dL (ref 6.0–8.5)
eGFR: 109 mL/min/{1.73_m2} (ref >59)

## 2021-03-10 LAB — HEMOGLOBIN A1C
Est. average glucose Bld gHb Est-mCnc: 111 mg/dL
Hgb A1c MFr Bld: 5.5 % (ref 4.8–5.6)

## 2021-03-10 LAB — LIPID PANEL
Chol/HDL Ratio: 3.3 ratio (ref 0.0–5.0)
Cholesterol, Total: 108 mg/dL (ref 100–199)
HDL: 33 mg/dL — ABNORMAL LOW (ref >39)
LDL Chol Calc (NIH): 46 mg/dL (ref 0–99)
Triglycerides: 170 mg/dL — ABNORMAL HIGH (ref 0–149)
VLDL Cholesterol Cal: 29 mg/dL (ref 5–40)

## 2021-05-01 ENCOUNTER — Telehealth: Payer: Self-pay | Admitting: Cardiology

## 2021-05-01 NOTE — Telephone Encounter (Signed)
Patient requesting refill, 3 month supply for metoprolol, aspirin, rosuvastatin, metformin. He is asking that these be sent to [facility].

## 2021-05-02 ENCOUNTER — Other Ambulatory Visit: Payer: Self-pay

## 2021-05-02 DIAGNOSIS — I2510 Atherosclerotic heart disease of native coronary artery without angina pectoris: Secondary | ICD-10-CM

## 2021-05-02 MED ORDER — ROSUVASTATIN CALCIUM 20 MG PO TABS: 20.0000 mg | ORAL_TABLET | Freq: Every day | ORAL | 3 refills | Status: DC

## 2021-05-02 MED ORDER — METFORMIN HCL 500 MG PO TABS: 500.0000 mg | ORAL_TABLET | Freq: Every day | ORAL | 3 refills | Status: DC

## 2021-05-02 MED ORDER — ASPIRIN EC 81 MG PO TBEC: 81.0000 mg | DELAYED_RELEASE_TABLET | Freq: Every day | ORAL | 3 refills | Status: DC

## 2021-05-02 MED ORDER — METOPROLOL SUCCINATE ER 25 MG PO TB24: 25.0000 mg | ORAL_TABLET | Freq: Every day | ORAL | 3 refills | Status: DC

## 2021-11-10 ENCOUNTER — Other Ambulatory Visit: Payer: Self-pay | Admitting: Cardiology

## 2021-11-10 DIAGNOSIS — I2510 Atherosclerotic heart disease of native coronary artery without angina pectoris: Secondary | ICD-10-CM

## 2021-11-14 ENCOUNTER — Other Ambulatory Visit: Payer: Self-pay

## 2021-11-14 DIAGNOSIS — I2510 Atherosclerotic heart disease of native coronary artery without angina pectoris: Secondary | ICD-10-CM

## 2021-11-14 MED ORDER — ICOSAPENT ETHYL 1 G PO CAPS: 2.0000 g | ORAL_CAPSULE | Freq: Two times a day (BID) | ORAL | 0 refills | Status: DC

## 2021-11-14 MED ORDER — NITROGLYCERIN 0.4 MG SL SUBL: 0.4000 mg | SUBLINGUAL_TABLET | SUBLINGUAL | 2 refills | Status: DC | PRN

## 2021-11-14 MED ORDER — METOPROLOL SUCCINATE ER 25 MG PO TB24: 25.0000 mg | ORAL_TABLET | Freq: Every day | ORAL | 3 refills | Status: DC

## 2021-12-14 ENCOUNTER — Ambulatory Visit: Payer: Managed Care, Other (non HMO) | Admitting: Cardiology

## 2022-01-18 ENCOUNTER — Encounter: Payer: Self-pay | Admitting: Cardiology

## 2022-01-18 ENCOUNTER — Ambulatory Visit: Payer: No Typology Code available for payment source | Admitting: Cardiology

## 2022-01-18 VITALS — BP 124/80 | Temp 98.1°F | Ht 66.0 in | Wt 144.2 lb

## 2022-01-18 DIAGNOSIS — E782 Mixed hyperlipidemia: Secondary | ICD-10-CM

## 2022-01-18 DIAGNOSIS — E119 Type 2 diabetes mellitus without complications: Secondary | ICD-10-CM

## 2022-01-18 DIAGNOSIS — I25118 Atherosclerotic heart disease of native coronary artery with other forms of angina pectoris: Secondary | ICD-10-CM

## 2022-01-18 MED ORDER — METOPROLOL SUCCINATE ER 25 MG PO TB24: 25.0000 mg | ORAL_TABLET | Freq: Every day | ORAL | 3 refills | Status: DC

## 2022-01-18 MED ORDER — ROSUVASTATIN CALCIUM 20 MG PO TABS: 20.0000 mg | ORAL_TABLET | Freq: Every day | ORAL | 3 refills | Status: DC

## 2022-01-18 MED ORDER — NITROGLYCERIN 0.4 MG SL SUBL: 0.4000 mg | SUBLINGUAL_TABLET | SUBLINGUAL | 2 refills | Status: DC | PRN

## 2022-01-18 MED ORDER — ICOSAPENT ETHYL 1 G PO CAPS: 2.0000 g | ORAL_CAPSULE | Freq: Two times a day (BID) | ORAL | 3 refills | Status: DC

## 2022-01-18 MED ORDER — ASPIRIN 81 MG PO TBEC: 81.0000 mg | DELAYED_RELEASE_TABLET | Freq: Every day | ORAL | 3 refills | Status: DC

## 2022-01-18 MED ORDER — METFORMIN HCL 500 MG PO TABS: 500.0000 mg | ORAL_TABLET | Freq: Every day | ORAL | 3 refills | Status: DC

## 2022-01-18 NOTE — Progress Notes (Signed)
Follow up visit  Subjective:   Peter Warren, male    DOB: 11-13-1970, 51 y.o.   MRN: [id number]   HPI  Chief Complaint  Patient presents with   Coronary Artery Disease   Follow-up    51 year old [place] man with type II diabetes mellitus, hypertriglyceridemia, single vessel coronary artery disease with ostial/prox LAD > 90% stenosis (CCTA 2019)   Patient had one episode of retrosternal burning sensation on lifting heavy items while moving her daughter into [place]. He has not had any other episodes of chest pain. That said, he has not been as active physically.    Current Outpatient Medications:    aspirin EC 81 MG tablet, Take 1 tablet (81 mg total) by mouth daily. Swallow whole., Disp: 90 tablet, Rfl: 3   empagliflozin (JARDIANCE) 25 MG TABS tablet, Take 1 tablet (25 mg total) by mouth daily., Disp: 90 tablet, Rfl: 3   icosapent Ethyl (VASCEPA) 1 g capsule, Take 2 capsules (2 g total) by mouth 2 (two) times daily., Disp: 180 capsule, Rfl: 0   metFORMIN (GLUCOPHAGE) 500 MG tablet, Take 1 tablet (500 mg total) by mouth daily with breakfast., Disp: 90 tablet, Rfl: 3   metoprolol succinate (TOPROL-XL) 25 MG 24 hr tablet, Take 1 tablet (25 mg total) by mouth daily., Disp: 90 tablet, Rfl: 3   nitroGLYCERIN (NITROSTAT) 0.4 MG SL tablet, Place 1 tablet (0.4 mg total) under the tongue every 5 (five) minutes as needed for chest pain., Disp: 25 tablet, Rfl: 2   rosuvastatin (CRESTOR) 20 MG tablet, Take 1 tablet (20 mg total) by mouth daily., Disp: 90 tablet, Rfl: 3  Cardiovascular & other pertient studies:  EKG 01/18/2022: Sinus rhythm 80 bpm  Normal EKG  CT FFR 01/31/2018: EXAM: CT FFR FINDINGS: The ostial LAD is modeled as occluded by CT FFR, but there is flow noted on the coronary CT angiogram.  IMPRESSION: Severe (95+%) ostial/proximal LAD stenosis.   Echocardiogram 02/07/2018: Left ventricle cavity is normal in size. Moderate concentric hypertrophy of the left ventricle.  Mild decrease in global wall motion. Visual EF is 45-50%. Doppler evidence of grade I (impaired) diastolic dysfunction, normal LAP.  No hemodynamically significant valvular abnormality. Normal right atrial pressure.   Recent labs: 03/09/2021: Glucose 107, BUN/Cr 11/0.81. EGFR 109. Na/K 140/4.4. T. Bili 1.4. Rest of the CMP normal HbA1C 5.5% Chol 108, TG 170, HDL 33, LDL 46   Review of Systems  Cardiovascular:  Positive for chest pain. Negative for dyspnea on exertion, leg swelling, palpitations and syncope.         Vitals:   01/18/22 1315  BP: 124/80  Temp: 98.1 F (36.7 C)  SpO2: 98%     Body mass index is 23.27 kg/m. Filed Weights   01/18/22 1315  Weight: 144 lb 3.2 oz (65.4 kg)     Objective:   Physical Exam Vitals and nursing note reviewed.  Constitutional:      General: He is not in acute distress. Neck:     Vascular: No JVD.  Cardiovascular:     Rate and Rhythm: Normal rate and regular rhythm.     Pulses: Intact distal pulses.     Heart sounds: Normal heart sounds. No murmur heard. Pulmonary:     Effort: Pulmonary effort is normal.     Breath sounds: Normal breath sounds. No wheezing or rales.  Musculoskeletal:     Right lower leg: No edema.     Left lower leg: No edema.  Assessment & Recommendations:   51 year old [place] man with type II diabetes mellitus, hypertriglyceridemia, single vessel coronary artery disease with ostial/prox LAD > 90% stenosis   CAD: Proximal LAD 95% stenosis diagnosed on coronary CT angiogram (2019) Episode of angina with more than usual exertion in 10/2021. Continue aspirin, statin, metoprolol, Jardiance, Vascepa. (Takes 1/5th of Jardiance 25 mg that he has from [place]. Asked him to take no less than 1/2).  Will check exercise nuclear stress test. Check CBC, BMP, lipid panel, A1C, lipoprotein (a)  Mixed hyperlipidemia: Well controlled. Check labs.  Type 2 DM: Continue Jardiance 25 mg. Check  A1C.  F/u in 6 months, unless significant abnormality on stress testing or recurrent angina    Nigel Mormon, MD Pager: [phone] Office: [phone]

## 2022-02-12 ENCOUNTER — Ambulatory Visit: Payer: No Typology Code available for payment source

## 2022-02-12 DIAGNOSIS — I25118 Atherosclerotic heart disease of native coronary artery with other forms of angina pectoris: Secondary | ICD-10-CM

## 2022-02-13 LAB — PCV MYOCARDIAL PERFUSION WO LEXISCAN
ST Depression (mm): 1.5 mm
T Wave inversion (mm): 0.5 mm

## 2022-02-13 MED ORDER — ISOSORBIDE MONONITRATE ER 30 MG PO TB24: 30.0000 mg | ORAL_TABLET | Freq: Every day | ORAL | 3 refills | Status: DC

## 2022-02-13 NOTE — Progress Notes (Signed)
Discussed stress test findings with the patient. He has class II angina symptoms-only with more than usual physical activity. Recommend adding Imdur 30 mg daily. If recurrent symptoms on optimal medical therapy, will then need coronary angiography and possible intervention. Patient is more in favor of medical and conservative therapy, as far as possible.  Elder Negus, MD

## 2022-02-19 ENCOUNTER — Other Ambulatory Visit: Payer: Self-pay

## 2022-02-19 DIAGNOSIS — I25118 Atherosclerotic heart disease of native coronary artery with other forms of angina pectoris: Secondary | ICD-10-CM

## 2022-02-19 MED ORDER — ROSUVASTATIN CALCIUM 20 MG PO TABS: 20.0000 mg | ORAL_TABLET | Freq: Every day | ORAL | 3 refills | Status: DC

## 2022-03-28 ENCOUNTER — Other Ambulatory Visit: Payer: Self-pay

## 2022-03-28 MED ORDER — METFORMIN HCL 500 MG PO TABS: 500.0000 mg | ORAL_TABLET | Freq: Every day | ORAL | 3 refills | Status: DC

## 2022-04-09 ENCOUNTER — Encounter: Payer: Self-pay | Admitting: Cardiology

## 2022-04-09 ENCOUNTER — Ambulatory Visit: Payer: No Typology Code available for payment source | Admitting: Cardiology

## 2022-04-09 VITALS — BP 138/91 | HR 68 | Resp 16 | Ht 66.0 in | Wt 138.0 lb

## 2022-04-09 DIAGNOSIS — R030 Elevated blood-pressure reading, without diagnosis of hypertension: Secondary | ICD-10-CM | POA: Insufficient documentation

## 2022-04-09 DIAGNOSIS — I25118 Atherosclerotic heart disease of native coronary artery with other forms of angina pectoris: Secondary | ICD-10-CM

## 2022-04-09 DIAGNOSIS — E782 Mixed hyperlipidemia: Secondary | ICD-10-CM

## 2022-04-09 DIAGNOSIS — E119 Type 2 diabetes mellitus without complications: Secondary | ICD-10-CM

## 2022-04-09 NOTE — Progress Notes (Signed)
Follow up visit  Subjective:   Peter Warren, male    DOB: 11-Jul-1970, 52 y.o.   MRN: [id number]   HPI  Chief Complaint  Patient presents with   Coronary Artery Disease   Follow-up    52 year old Asian [place] man with type II diabetes mellitus, hypertriglyceridemia, single vessel coronary artery disease with ostial/prox LAD > 90% stenosis (CCTA 2019)   Patient is doing well, denies chest pain, shortness of breath, palpitations, leg edema, orthopnea, PND, TIA/syncope.He has moved to a two level house, climbs 15 steps staircase up and down 15-20 times in a day.    Current Outpatient Medications:    aspirin EC 81 MG tablet, Take 1 tablet (81 mg total) by mouth daily. Swallow whole., Disp: 90 tablet, Rfl: 3   empagliflozin (JARDIANCE) 25 MG TABS tablet, Take 1 tablet (25 mg total) by mouth daily. (Patient taking differently: Take 6.25 mg by mouth daily.), Disp: 90 tablet, Rfl: 3   icosapent Ethyl (VASCEPA) 1 g capsule, Take 2 capsules (2 g total) by mouth 2 (two) times daily., Disp: 180 capsule, Rfl: 3   isosorbide mononitrate (IMDUR) 30 MG 24 hr tablet, Take 1 tablet (30 mg total) by mouth daily., Disp: 90 tablet, Rfl: 3   metFORMIN (GLUCOPHAGE) 500 MG tablet, Take 1 tablet (500 mg total) by mouth daily with breakfast., Disp: 90 tablet, Rfl: 3   metoprolol succinate (TOPROL-XL) 25 MG 24 hr tablet, Take 1 tablet (25 mg total) by mouth daily., Disp: 90 tablet, Rfl: 3   nitroGLYCERIN (NITROSTAT) 0.4 MG SL tablet, Place 1 tablet (0.4 mg total) under the tongue every 5 (five) minutes as needed for chest pain., Disp: 25 tablet, Rfl: 2  Cardiovascular & other pertient studies:  Lexiscan Sestamibi stress test 02/12/2022: Lexiscan/modified Bruce nuclear stress test performed using 1-day protocol. Patient achieved 7 METS and reached 68% MPHR. After injection the patient developed chest pain level 6 of 10. Stress EKG is non-diagnostic, as this is pharmacological stress test. In addition,  stress EKG at 68% MPHR showed sinus tachycardia, 1-1.5 mm horizontal ST depression in leads II, III, aVF, V5-V6, with partial recovery at 2 min with residual nonspecific T wave inversion in leads II, III, aVF, presence of frequent PVCs and ventricular bigeminy during stress with resolution during recovery.  SPECT images show small sized, medium intensity, reversible perfusion defect apical septal, apical anterior myocardium with associated mild wall motion abnormality. Stress LVEF 44%.  Intermediate risk study.    EKG 01/18/2022: Sinus rhythm 80 bpm  Normal EKG  CT FFR 01/31/2018: EXAM: CT FFR FINDINGS: The ostial LAD is modeled as occluded by CT FFR, but there is flow noted on the coronary CT angiogram.  IMPRESSION: Severe (95+%) ostial/proximal LAD stenosis.   Echocardiogram 02/07/2018: Left ventricle cavity is normal in size. Moderate concentric hypertrophy of the left ventricle. Mild decrease in global wall motion. Visual EF is 45-50%. Doppler evidence of grade I (impaired) diastolic dysfunction, normal LAP.  No hemodynamically significant valvular abnormality. Normal right atrial pressure.   Recent labs: 03/09/2021: Glucose 107, BUN/Cr 11/0.81. EGFR 109. Na/K 140/4.4. T. Bili 1.4. Rest of the CMP normal HbA1C 5.5% Chol 108, TG 170, HDL 33, LDL 46   Review of Systems  Cardiovascular:  Positive for chest pain. Negative for dyspnea on exertion, leg swelling, palpitations and syncope.         Vitals:   04/09/22 0855  BP: (!) 138/91  Pulse: 68  Resp: 16  SpO2: 98%  Body mass index is 23.27 kg/m. Filed Weights   04/09/22 0855  Weight: 138 lb (62.6 kg)      Objective:   Physical Exam Vitals and nursing note reviewed.  Constitutional:      General: He is not in acute distress. Neck:     Vascular: No JVD.  Cardiovascular:     Rate and Rhythm: Normal rate and regular rhythm.     Pulses: Intact distal pulses.     Heart sounds: Normal heart sounds. No  murmur heard. Pulmonary:     Effort: Pulmonary effort is normal.     Breath sounds: Normal breath sounds. No wheezing or rales.  Musculoskeletal:     Right lower leg: No edema.     Left lower leg: No edema.          Assessment & Recommendations:   52 year old Asian [place] man with type II diabetes mellitus, hypertriglyceridemia, single vessel coronary artery disease with ostial/prox LAD > 90% stenosis   CAD: Proximal LAD 95% stenosis diagnosed on coronary CT angiogram (2019) SPECT images show small sized, medium intensity, reversible perfusion defect apical septal, apical anterior myocardium with associated mild wall motion abnormality. Stress LVEF 44%. Intermediate risk study. (01/2022). No recurrent angina symptoms at this time. Continue aspirin, statin, metoprolol, Jardiance (Okay to take 10 mg). Not taking Vascepa, Check lipid panel.  Check BMP, lipid panel, A1C, lipoprotein (a)  Elevated blood pressure without diagnosis of hypertension: BP elevated today, generally 115/70s at home. Monitor for now. If consistently elevated ?130/85 mmHg, will add amlodipine.  Mixed hyperlipidemia: Well controlled. Check labs.  Type 2 DM: Continue Jardiance 25 mg. Check A1C.  F/u in 6 months    Peter Mormon, MD Pager: [phone] Office: [phone]

## 2022-06-08 ENCOUNTER — Other Ambulatory Visit: Payer: Self-pay

## 2022-06-08 DIAGNOSIS — I25118 Atherosclerotic heart disease of native coronary artery with other forms of angina pectoris: Secondary | ICD-10-CM

## 2022-06-08 MED ORDER — METOPROLOL SUCCINATE ER 25 MG PO TB24: 25.0000 mg | ORAL_TABLET | Freq: Every day | ORAL | 0 refills | Status: DC

## 2022-07-20 ENCOUNTER — Ambulatory Visit: Payer: Managed Care, Other (non HMO) | Admitting: Cardiology

## 2022-07-27 ENCOUNTER — Ambulatory Visit: Payer: Managed Care, Other (non HMO) | Admitting: Cardiology

## 2022-10-02 ENCOUNTER — Other Ambulatory Visit: Payer: Self-pay

## 2022-10-02 DIAGNOSIS — I2510 Atherosclerotic heart disease of native coronary artery without angina pectoris: Secondary | ICD-10-CM

## 2022-10-02 DIAGNOSIS — I25118 Atherosclerotic heart disease of native coronary artery with other forms of angina pectoris: Secondary | ICD-10-CM

## 2022-10-02 MED ORDER — ASPIRIN 81 MG PO TBEC: 81.0000 mg | DELAYED_RELEASE_TABLET | Freq: Every day | ORAL | 3 refills | Status: DC

## 2022-10-02 MED ORDER — ISOSORBIDE MONONITRATE ER 30 MG PO TB24: 30.0000 mg | ORAL_TABLET | Freq: Every day | ORAL | 3 refills | Status: DC

## 2022-10-02 MED ORDER — EMPAGLIFLOZIN 25 MG PO TABS: 25.0000 mg | ORAL_TABLET | Freq: Every day | ORAL | 3 refills | Status: DC

## 2022-10-02 MED ORDER — METOPROLOL SUCCINATE ER 25 MG PO TB24: 25.0000 mg | ORAL_TABLET | Freq: Every day | ORAL | 3 refills | Status: DC

## 2022-10-02 MED ORDER — ICOSAPENT ETHYL 1 G PO CAPS: 2.0000 g | ORAL_CAPSULE | Freq: Two times a day (BID) | ORAL | 3 refills | Status: DC

## 2022-10-08 ENCOUNTER — Ambulatory Visit: Payer: Managed Care, Other (non HMO) | Admitting: Cardiology

## 2022-10-10 ENCOUNTER — Other Ambulatory Visit: Payer: Self-pay

## 2022-10-10 ENCOUNTER — Telehealth: Payer: Self-pay

## 2022-10-10 MED ORDER — ROSUVASTATIN CALCIUM 20 MG PO TABS: 20.0000 mg | ORAL_TABLET | Freq: Every day | ORAL | 3 refills | Status: DC

## 2022-10-10 NOTE — Telephone Encounter (Signed)
Patient is asking for a refill on rosuvastatin. I see on your not you discontinued and added vascepa. On your assessment note you have continue with statin. Please advise. Do you want me to refill it or not.

## 2022-10-10 NOTE — Telephone Encounter (Signed)
Continue satin. Okay to refill 90 pillsX3 refills. Will discuss more during upcoming office visit in August.

## 2022-11-12 ENCOUNTER — Ambulatory Visit: Payer: Managed Care, Other (non HMO) | Admitting: Cardiology

## 2022-11-26 ENCOUNTER — Other Ambulatory Visit: Payer: Self-pay

## 2022-11-26 MED ORDER — ASPIRIN 81 MG PO TBEC: 81.0000 mg | DELAYED_RELEASE_TABLET | Freq: Every day | ORAL | 3 refills | Status: AC

## 2022-12-14 ENCOUNTER — Ambulatory Visit: Payer: Managed Care, Other (non HMO) | Admitting: Cardiology

## 2022-12-23 NOTE — Progress Notes (Unsigned)
Cardiology Office Note    Patient Name: Peter Warren Date of Encounter: 12/23/2022  Primary Care Provider:  Patient, No Pcp Per Primary Cardiologist:  None Primary Electrophysiologist: None   Past Medical History    Past Medical History:  Diagnosis Date   Angina pectoris (HCC)    Coronary artery disease    Diabetes type 2, controlled (HCC)    Hyperlipidemia    Hypertension     History of Present Illness  Peter Warren is a 52 y.o. male with a PMH of CAD with 90% LAD stenosis on CCTA in 2019, DM type II, HLD who presents today for 9-month follow-up.  [name] is been followed by Dr. Joaquim Nam since being seen in 2020 for LAD stenosis on coronary CTA.  During visit patient was given the option of medical therapy versus stenting and he opted for medical therapy.  He had tremendous lifestyle adjustments and medical regimen was optimized.  In 2023 he reported 1 episode of retrosternal chest pain that went [facility] that was intermediate risk with ST depression in leads II, III, aVF, V5-V6, with partial recovery at 2 min with residual nonspecific T wave inversion in leads II, III, aVF, presence of frequent PVCs and ventricular bigeminy during stress. Imdur 30 mg was added to current regimen. Patient was in favor of following conservative therapy but understood that coronary angiography with possible intervention would occur if symptoms persisted.  He was last seen on 04/09/22 and reported doing well with no complaints of chest pain.  He noted living in two-level home with 15 steps and denied any angina.  He was continued on his current medical therapy and BP was noted to be elevated with instructions to monitor at home with possible addition of amlodipine.  During today's visit the patient reports that he has been doing well with no episodes of unstable angina.  He does report chest discomfort with extreme lifting but otherwise does not experience any symptoms with walking upstairs or  with normal activities.  He has not required any as needed nitro since his previous follow-up.  He does not follow a normal structured exercise routine but does report doing lots of walking.  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  He is compliant with his current medications and denies any adverse reactions.  He does report following a heart healthy vegetarian diet..  Patient denies chest pain, palpitations, dyspnea, PND, orthopnea, nausea, vomiting, dizziness, syncope, edema, weight gain, or early satiety.  Review of Systems  Please see the history of present illness.    All other systems reviewed and are otherwise negative except as noted above.  Physical Exam    Wt Readings from Last 3 Encounters:  04/09/22 138 lb (62.6 kg)  01/18/22 144 lb 3.2 oz (65.4 kg)  12/14/20 140 lb (63.5 kg)   ZO:XWRUE were no vitals filed for this visit.,There is no height or weight on file to calculate BMI. GEN: Well nourished, well developed in no acute distress Neck: No JVD; No carotid bruits Pulmonary: Clear to auscultation without rales, wheezing or rhonchi  Cardiovascular: Normal rate. Regular rhythm. Normal S1. Normal S2.   Murmurs: There is no murmur.  ABDOMEN: Soft, non-tender, non-distended EXTREMITIES:  No edema; No deformity   EKG/LABS/ Recent Cardiac Studies   ECG personally reviewed by me today -sinus rhythm with rate of 69 bpm flat T wave in lead I consistent with previous EKG with no acute changes.  Risk Assessment/Calculations:  No results found for: "WBC", "HGB", "HCT", "MCV", "PLT" Lab Results  Component Value Date   CREATININE 0.81 03/09/2021   BUN 11 03/09/2021   NA 140 03/09/2021   K 4.4 03/09/2021   CL 101 03/09/2021   CO2 26 03/09/2021   Lab Results  Component Value Date   CHOL 108 03/09/2021   HDL 33 (L) 03/09/2021   LDLCALC 46 03/09/2021   TRIG 170 (H) 03/09/2021   CHOLHDL 3.3 03/09/2021    Lab Results  Component Value Date    HGBA1C 5.5 03/09/2021   Assessment & Plan    1.  Coronary artery disease: -Previous CCTA completed 2019 with 95% LAD stenosis noted and myocardial perfusion scan completed 01/2022 showing intermediate risk with small reversible defect noted. -Since follow-up patient reports no unstable angina but does report chest tightness with extreme lifting. -He is aware that if chest pain returns he would require further evaluation and ischemic workup. -He was advised to increase physical activity to at least 150 minutes/week. -Continue current GDMT with ASA 81 mg, Vascepa 1 g, Imdur 30 mg, Toprol-XL 25 mg, Crestor 20 mg and Nitrostat 0.4 mg as needed  2.  Essential hypertension: -Patient's blood pressure was mildly elevated at previous OV and today was controlled at 118/76 -Continue Toprol-XL 25 mg daily  3.  Hyperlipidemia: -Patient's last LDL cholesterol was 46 in 8119 -We will check LFTs and lipids today -Continue Crestor 20 mg for now pending results of lipids  4.  DM type II:  -Patient's last hemoglobin A1c was last checked in 2022 and was 5.5 -Have PCP and we will check hemoglobin A1c today -Continue Jardiance 25 mg and metformin 500 mg daily   Disposition: Follow-up with None or APP in 6 months    Signed, Napoleon Form, Leodis Rains, NP 12/23/2022, 5:26 PM [facility]

## 2022-12-25 ENCOUNTER — Ambulatory Visit: Payer: Managed Care, Other (non HMO) | Attending: Cardiology | Admitting: Nurse Practitioner

## 2022-12-25 ENCOUNTER — Encounter: Payer: Self-pay | Admitting: Nurse Practitioner

## 2022-12-25 VITALS — BP 118/76 | HR 74 | Ht 66.0 in | Wt 146.6 lb

## 2022-12-25 DIAGNOSIS — I2510 Atherosclerotic heart disease of native coronary artery without angina pectoris: Secondary | ICD-10-CM

## 2022-12-25 DIAGNOSIS — R030 Elevated blood-pressure reading, without diagnosis of hypertension: Secondary | ICD-10-CM | POA: Diagnosis not present

## 2022-12-25 DIAGNOSIS — E782 Mixed hyperlipidemia: Secondary | ICD-10-CM | POA: Diagnosis not present

## 2022-12-25 DIAGNOSIS — E785 Hyperlipidemia, unspecified: Secondary | ICD-10-CM

## 2022-12-25 DIAGNOSIS — E119 Type 2 diabetes mellitus without complications: Secondary | ICD-10-CM | POA: Diagnosis not present

## 2022-12-25 MED ORDER — ISOSORBIDE MONONITRATE ER 30 MG PO TB24: 30.0000 mg | ORAL_TABLET | Freq: Every day | ORAL | 3 refills | Status: DC

## 2022-12-25 MED ORDER — METFORMIN HCL 500 MG PO TABS: 500.0000 mg | ORAL_TABLET | Freq: Every day | ORAL | 0 refills | Status: DC

## 2022-12-25 MED ORDER — METOPROLOL SUCCINATE ER 25 MG PO TB24: 25.0000 mg | ORAL_TABLET | Freq: Every day | ORAL | 3 refills | Status: DC

## 2022-12-25 NOTE — Patient Instructions (Addendum)
Medication Instructions:  Your physician recommends that you continue on your current medications as directed. Please refer to the Current Medication list given to you today. *If you need a refill on your cardiac medications before your next appointment, please call your pharmacy*   Lab Work: TODAY-CMET, HGB A1C, LIPIDS If you have labs (blood work) drawn today and your tests are completely normal, you will receive your results only by: MyChart Message (if you have MyChart) OR A paper copy in the mail If you have any lab test that is abnormal or we need to change your treatment, we will call you to review the results.   Testing/Procedures: NONE ORDERED   Follow-Up: At [facility], you and your health needs are our priority.  As part of our continuing mission to provide you with exceptional heart care, we have created designated Provider Care Teams.  These Care Teams include your primary Cardiologist (physician) and Advanced Practice Providers (APPs -  Physician Assistants and Nurse Practitioners) who all work together to provide you with the care you need, when you need it.  We recommend signing up for the patient portal called "MyChart".  Sign up information is provided on this After Visit Summary.  MyChart is used to connect with patients for Virtual Visits (Telemedicine).  Patients are able to view lab/test results, encounter notes, upcoming appointments, etc.  Non-urgent messages can be sent to your provider as well.   To learn more about what you can do with MyChart, go to [email or web address].    Your next appointment:   6 month(s)  Provider:   Truett Mainland, MD   Other Instructions  Primary care:   [facility] Phone: [phone] Address: [address] [place], [place], [place] [address]

## 2022-12-26 LAB — LIPID PANEL
Cholesterol, Total: 149 mg/dL (ref 100–199)
HDL: 39 mg/dL — ABNORMAL LOW (ref >39)
LDL CALC COMMENT:: 3.8 ratio (ref 0.0–5.0)
LDL Chol Calc (NIH): 72 mg/dL (ref 0–99)
Triglycerides: 234 mg/dL — ABNORMAL HIGH (ref 0–149)
VLDL Cholesterol Cal: 38 mg/dL (ref 5–40)

## 2022-12-26 LAB — COMPREHENSIVE METABOLIC PANEL
ALT: 45 IU/L — ABNORMAL HIGH (ref 0–44)
AST: 35 IU/L (ref 0–40)
Albumin: 4.7 g/dL (ref 4.1–5.1)
Alkaline Phosphatase: 60 IU/L (ref 44–121)
BUN/Creatinine Ratio: 13 (ref 9–20)
BUN: 11 mg/dL (ref 6–24)
Bilirubin Total: 1.3 mg/dL — ABNORMAL HIGH (ref 0.0–1.2)
CO2: 23 mmol/L (ref 20–29)
Calcium: 9.2 mg/dL (ref 8.7–10.2)
Chloride: 103 mmol/L (ref 96–106)
Creatinine, Ser: 0.85 mg/dL (ref 0.76–1.27)
Globulin, Total: 2.5 g/dL (ref 1.5–4.5)
Glucose: 122 mg/dL — ABNORMAL HIGH (ref 70–99)
Potassium: 4.3 mmol/L (ref 3.5–5.2)
Sodium: 141 mmol/L (ref 134–144)
Total Protein: 7.2 g/dL (ref 6.0–8.5)
eGFR: 106 mL/min/{1.73_m2} (ref >59)

## 2022-12-26 LAB — HEMOGLOBIN A1C
Est. average glucose Bld gHb Est-mCnc: 126 mg/dL
Hgb A1c MFr Bld: 6 % — ABNORMAL HIGH (ref 4.8–5.6)

## 2022-12-28 ENCOUNTER — Other Ambulatory Visit: Payer: Self-pay

## 2022-12-28 DIAGNOSIS — I25118 Atherosclerotic heart disease of native coronary artery with other forms of angina pectoris: Secondary | ICD-10-CM

## 2022-12-28 MED ORDER — NITROGLYCERIN 0.4 MG SL SUBL: 0.4000 mg | SUBLINGUAL_TABLET | SUBLINGUAL | 2 refills | Status: DC | PRN

## 2023-02-22 ENCOUNTER — Other Ambulatory Visit: Payer: Self-pay | Admitting: *Deleted

## 2023-02-22 MED ORDER — ROSUVASTATIN CALCIUM 20 MG PO TABS: 20.0000 mg | ORAL_TABLET | Freq: Every day | ORAL | 3 refills | Status: DC

## 2023-02-26 ENCOUNTER — Telehealth: Payer: Self-pay | Admitting: Cardiology

## 2023-02-26 DIAGNOSIS — I25118 Atherosclerotic heart disease of native coronary artery with other forms of angina pectoris: Secondary | ICD-10-CM

## 2023-02-26 MED ORDER — ICOSAPENT ETHYL 1 G PO CAPS: 2.0000 g | ORAL_CAPSULE | Freq: Two times a day (BID) | ORAL | 3 refills | Status: DC

## 2023-02-26 NOTE — Telephone Encounter (Signed)
*  STAT* If patient is at the pharmacy, call can be transferred to refill team.   1. Which medications need to be refilled? (please list name of each medication and dose if known) Icosapent   2. Would you like to learn more about the convenience, safety, & potential cost savings by using the [facility]?      3. Are you open to using the [facility] (Type [facility].    4. Which pharmacy/location (including street and city if local pharmacy) is medication to be sent to?[facility]   5. Do they need a 30 day or 90 day supply? 90 days and refills

## 2023-03-04 ENCOUNTER — Telehealth: Payer: Self-pay | Admitting: Pharmacy Technician

## 2023-03-04 ENCOUNTER — Other Ambulatory Visit (HOSPITAL_COMMUNITY): Payer: Self-pay

## 2023-03-04 ENCOUNTER — Telehealth: Payer: Self-pay | Admitting: Cardiology

## 2023-03-04 NOTE — Telephone Encounter (Signed)
Pharmacy Patient Advocate Encounter   Received notification from Pt Calls Messages that prior authorization for vascepa is required/requested.   Insurance verification completed.   The patient is insured through [employer] .   Per test claim: PA required; PA submitted to above mentioned insurance via CoverMyMeds Key/confirmation #/[facility] Status is pending

## 2023-03-04 NOTE — Telephone Encounter (Signed)
  Pt aware of PA approval for Vascepa, done by prior auth team. He is aware this was submitted to [facility] and to touch base with them soon, if they are still saying he is denied.   Pt verbalized understanding and agrees with this plan.

## 2023-03-04 NOTE — Telephone Encounter (Signed)
Will route this message to our PA team for further management initiation of prior auth for vascepa.   Will cc PharmD in on this to see if they can further assist with this matter.

## 2023-03-04 NOTE — Telephone Encounter (Signed)
Pt c/o medication issue:  1. Name of Medication:   icosapent Ethyl (VASCEPA) 1 g capsule    2. How are you currently taking this medication (dosage and times per day)?  Take 2 capsules (2 g total) by mouth 2 (two) times daily.       3. Are you having a reaction (difficulty breathing--STAT)? No  4. What is your medication issue? Pt is requesting a callback regarding him receiving a call from [facility] stating the PA was denied for this medication and he'd like to discuss further with nurse. Please advise

## 2023-03-04 NOTE — Telephone Encounter (Signed)
Message  PA request has been Approved. New Encounter created for follow up. For additional info see Pharmacy Prior Auth telephone encounter from 03/04/23.    Pt aware of PA approval for Vascepa, done by prior auth team. He is aware this was submitted to [facility] and to touch base with them soon, if they are still saying he is denied.   Pt verbalized understanding and agrees with this plan.

## 2023-03-04 NOTE — Telephone Encounter (Signed)
Pharmacy Patient Advocate Encounter  Received notification from [facility] that Prior Authorization for vascepa has been APPROVED from 02/02/23 to 03/04/23. Ran test claim, Copay is $0.00- one month. This test claim was processed through [facility]- copay amounts may vary at other pharmacies due to pharmacy/plan contracts, or as the patient moves through the different stages of their insurance plan.   PA #/Case ID/Reference #: [id number]

## 2023-03-04 NOTE — Telephone Encounter (Signed)
Akoni, Morrisson - 03/04/2023  3:26 PM Art Buff, CPhT  Sent: Mon March 04, 2023  3:50 PM  To: Loa Socks, LPN         Message  I am not sure- patient may have submitted but I will see what I can do and update  office

## 2023-04-13 ENCOUNTER — Other Ambulatory Visit: Payer: Self-pay | Admitting: Nurse Practitioner

## 2023-08-26 ENCOUNTER — Telehealth: Payer: Self-pay | Admitting: Cardiology

## 2023-08-26 NOTE — Telephone Encounter (Signed)
 Refill requested too soon.

## 2023-08-26 NOTE — Telephone Encounter (Signed)
*  STAT* If patient is at the pharmacy, call can be transferred to refill team.   1. Which medications need to be refilled? (please list name of each medication and dose if known) icosapent  Ethyl (VASCEPA ) 1 g capsule   2. Which pharmacy/location (including street and city if local pharmacy) is medication to be sent to? [facility] - [place], [place] - [address]  3. Do they need a 30 day or 90 day supply? 90

## 2023-11-15 ENCOUNTER — Ambulatory Visit: Admitting: Cardiology

## 2023-11-27 ENCOUNTER — Other Ambulatory Visit: Payer: Self-pay | Admitting: Nurse Practitioner

## 2023-12-19 ENCOUNTER — Other Ambulatory Visit: Payer: Self-pay | Admitting: Nurse Practitioner

## 2023-12-19 NOTE — Telephone Encounter (Signed)
 Pt of Dr. Elmira. Does Dr. Elmira want to refill this Non-Cardiac RX? Please advise.

## 2023-12-19 NOTE — Telephone Encounter (Signed)
 Please have PCP refill metformin .

## 2023-12-20 NOTE — Telephone Encounter (Signed)
 I am not sure he has a PCP right now.  Okay to send prescription for now.  I will be seeing him on 16th.  Thanks MJP

## 2023-12-23 MED ORDER — METFORMIN HCL 500 MG PO TABS: 500.0000 mg | ORAL_TABLET | Freq: Every day | ORAL | 0 refills | Status: DC

## 2023-12-23 NOTE — Addendum Note (Signed)
 Addended by: BLUFORD, Sheritta Deeg L on: 12/23/2023 08:17 AM   Modules accepted: Orders

## 2024-01-02 ENCOUNTER — Encounter (HOSPITAL_BASED_OUTPATIENT_CLINIC_OR_DEPARTMENT_OTHER): Payer: Self-pay | Admitting: *Deleted

## 2024-01-02 ENCOUNTER — Ambulatory Visit: Attending: Cardiology | Admitting: Cardiology

## 2024-01-02 ENCOUNTER — Encounter: Payer: Self-pay | Admitting: Cardiology

## 2024-01-02 VITALS — BP 120/80 | HR 68 | Ht 66.0 in | Wt 147.8 lb

## 2024-01-02 DIAGNOSIS — E782 Mixed hyperlipidemia: Secondary | ICD-10-CM

## 2024-01-02 DIAGNOSIS — I25118 Atherosclerotic heart disease of native coronary artery with other forms of angina pectoris: Secondary | ICD-10-CM

## 2024-01-02 DIAGNOSIS — E119 Type 2 diabetes mellitus without complications: Secondary | ICD-10-CM | POA: Diagnosis not present

## 2024-01-02 LAB — LIPID PANEL

## 2024-01-02 MED ORDER — ROSUVASTATIN CALCIUM 20 MG PO TABS: 20.0000 mg | ORAL_TABLET | Freq: Every day | ORAL | 3 refills | Status: DC

## 2024-01-02 MED ORDER — ICOSAPENT ETHYL 1 G PO CAPS: 2.0000 g | ORAL_CAPSULE | Freq: Two times a day (BID) | ORAL | 3 refills | Status: DC

## 2024-01-02 NOTE — Patient Instructions (Signed)
 Medication Instructions:  No changes *If you need a refill on your cardiac medications before your next appointment, please call your pharmacy*  Lab Work: Today: lipid panel, A1c, cmp  If you have labs (blood work) drawn today and your tests are completely normal, you will receive your results only by: MyChart Message (if you have MyChart) OR A paper copy in the mail If you have any lab test that is abnormal or we need to change your treatment, we will call you to review the results.  Testing/Procedures: Your physician has requested that you have en exercise stress myoview. For further information please visit [email or web address] Please follow instruction sheet, as given.   Follow-Up: At [facility], you and your health needs are our priority.  As part of our continuing mission to provide you with exceptional heart care, our providers are all part of one team.  This team includes your primary Cardiologist (physician) and Advanced Practice Providers or APPs (Physician Assistants and Nurse Practitioners) who all work together to provide you with the care you need, when you need it.  Your next appointment:   3 month(s)  Provider:   Newman Lawrence, MD

## 2024-01-02 NOTE — Progress Notes (Addendum)
 Cardiology Office Note:  .   Date:  01/02/2024  ID:  Peter Warren, DOB 11-29-1970, MRN [id number] PCP: Patient, No Pcp Per  [facility] Providers Cardiologist:  Newman Lawrence, MD PCP: Patient, No Pcp Per  Chief Complaint  Patient presents with   Coronary Artery Disease     Peter Warren is a 53 y.o. male with type II diabetes mellitus, hypertriglyceridemia, single vessel coronary artery disease with ostial/prox LAD > 90% stenosis (CCTA 2019)    History of Present Illness  I have been seeing the patient since 2019 when he had first and complaints of chest pain.  Coronary CT angiogram at that time hide showed proximal LAD stenosis >90%.  However, patient had opted for medical management and did not want to undergo cardiac catheterization.  He also met several changes to his diet.  He did surprisingly well ever since with medical management.  Stress test in 01/2022 had showed mild ischemia, with mild reduced stress LVEF.  Again he had opted for medical management.  Patient is here for follow-up today.  He admits to not taking medications every day, but tries to skip on certain days, just to see how his heart is doing.  He has had occasional chest pain episodes, not requiring sublingual nitroglycerin .  He does report fatigue with more than usual exertion.     Vitals:   01/02/24 0941  BP: 120/80  Pulse: 68  SpO2: 98%      Review of Systems  Constitutional: Positive for malaise/fatigue.  Cardiovascular:  Positive for chest pain. Negative for dyspnea on exertion, leg swelling, palpitations and syncope.        Studies Reviewed: SABRA        EKG 01/02/2024: Normal sinus rhythm Normal ECG When compared with ECG of 25-Dec-2022 08:10, T wave inversion now evident in Inferior leads    CCTA 2019: 1. Coronary artery calcium  score 13.2 Agatston units. This places the patient in the 84th percentile for age and gender. 2. There appears to be severe (>90%) proximal  LAD stenosis.  Labs 12/2022: Chol 149, TG 234, HDL 39, LDL 72 HbA1C 6.0% Hb 13.2 Cr 0.8   Physical Exam Vitals and nursing note reviewed.  Constitutional:      General: He is not in acute distress. Neck:     Vascular: No JVD.  Cardiovascular:     Rate and Rhythm: Normal rate and regular rhythm.     Heart sounds: Normal heart sounds. No murmur heard. Pulmonary:     Effort: Pulmonary effort is normal.     Breath sounds: Normal breath sounds. No wheezing or rales.  Musculoskeletal:     Right lower leg: No edema.     Left lower leg: No edema.      VISIT DIAGNOSES:   ICD-10-CM   1. Coronary artery disease of native artery of native heart with stable angina pectoris  I25.118 icosapent  Ethyl (VASCEPA ) 1 g capsule    EKG 12-Lead    Comprehensive metabolic panel with GFR    Lipid panel    Hemoglobin A1c    MYOCARDIAL PERFUSION IMAGING    Cardiac Stress Test: Informed Consent Details: Physician/Practitioner Attestation; Transcribe to consent form and obtain patient signature    CANCELED: EKG 12-Lead    2. Mixed hyperlipidemia  E78.2     3. Controlled type 2 diabetes mellitus without complication, without long-term current use of insulin (HCC)  E11.9        Peter Warren is a 53 y.o. male  with type II diabetes mellitus, hypertriglyceridemia, single vessel coronary artery disease with ostial/prox LAD > 90% stenosis (CCTA 2019)   Assessment & Plan  CAD: Proximal LAD 95% stenosis diagnosed on coronary CT angiogram (2019) SPECT images show small sized, medium intensity, reversible perfusion defect apical septal, apical anterior myocardium with associated mild wall motion abnormality. Stress LVEF 44%. Intermediate risk study. (01/2022). Occasional anginal episodes as as well as exertional fatigue symptoms. Recommend exercise nuclear stress test for risk stratification. Emphasize importance of compliance with medications, including aspirin , statin, metoprolol  succinate, Imdur ,  Jardiance , Crestor , Vascepa .  Refilled aspirin , metoprolol , Imdur .  Will refill rest of his medications based on lab results. Check BMP, lipid panel, A1C, lipoprotein (a)  Mixed hyperlipidemia, type 2 DM: Recommendations as above.  I encouraged the patient to establish care with a primary care provider to address noncardiac issues    Informed Consent   Shared Decision Making/Informed Consent The risks [chest pain, shortness of breath, cardiac arrhythmias, dizziness, blood pressure fluctuations, myocardial infarction, stroke/transient ischemic attack, nausea, vomiting, allergic reaction, radiation exposure, metallic taste sensation and life-threatening complications (estimated to be 1 in 10,000)], benefits (risk stratification, diagnosing coronary artery disease, treatment guidance) and alternatives of a nuclear stress test were discussed in detail with Peter Warren and he agrees to proceed.       Meds ordered this encounter  Medications   icosapent  Ethyl (VASCEPA ) 1 g capsule    Sig: Take 2 capsules (2 g total) by mouth 2 (two) times daily.    Dispense:  180 capsule    Refill:  3   rosuvastatin  (CRESTOR ) 20 MG tablet    Sig: Take 1 tablet (20 mg total) by mouth daily.    Dispense:  90 tablet    Refill:  3     F/u in 3 months  Signed, Newman JINNY Lawrence, MD

## 2024-01-03 ENCOUNTER — Ambulatory Visit: Payer: Self-pay | Admitting: Cardiology

## 2024-01-03 ENCOUNTER — Encounter (HOSPITAL_COMMUNITY): Payer: Self-pay | Admitting: *Deleted

## 2024-01-03 DIAGNOSIS — E782 Mixed hyperlipidemia: Secondary | ICD-10-CM

## 2024-01-03 LAB — COMPREHENSIVE METABOLIC PANEL WITH GFR
ALT: 58 IU/L — ABNORMAL HIGH (ref 0–44)
AST: 51 IU/L — ABNORMAL HIGH (ref 0–40)
Albumin: 4.8 g/dL (ref 3.8–4.9)
Alkaline Phosphatase: 71 IU/L (ref 47–123)
BUN/Creatinine Ratio: 10 (ref 9–20)
BUN: 9 mg/dL (ref 6–24)
Bilirubin Total: 1.4 mg/dL — ABNORMAL HIGH (ref 0.0–1.2)
CO2: 23 mmol/L (ref 20–29)
Calcium: 9.5 mg/dL (ref 8.7–10.2)
Chloride: 102 mmol/L (ref 96–106)
Creatinine, Ser: 0.89 mg/dL (ref 0.76–1.27)
Globulin, Total: 2.1 g/dL (ref 1.5–4.5)
Glucose: 113 mg/dL — ABNORMAL HIGH (ref 70–99)
Potassium: 4.4 mmol/L (ref 3.5–5.2)
Sodium: 139 mmol/L (ref 134–144)
Total Protein: 6.9 g/dL (ref 6.0–8.5)
eGFR: 104 mL/min/1.73 (ref >59)

## 2024-01-03 LAB — LIPID PANEL
Chol/HDL Ratio: 4.2 ratio (ref 0.0–5.0)
Cholesterol, Total: 152 mg/dL (ref 100–199)
HDL: 36 mg/dL — ABNORMAL LOW (ref >39)
LDL Chol Calc (NIH): 80 mg/dL (ref 0–99)
Triglycerides: 212 mg/dL — ABNORMAL HIGH (ref 0–149)
VLDL Cholesterol Cal: 36 mg/dL (ref 5–40)

## 2024-01-03 LAB — HEMOGLOBIN A1C
Est. average glucose Bld gHb Est-mCnc: 137 mg/dL
Hgb A1c MFr Bld: 6.4 % — ABNORMAL HIGH (ref 4.8–5.6)

## 2024-01-03 NOTE — Progress Notes (Signed)
 Please order fasting lipid panel and LFT t be checked in 1 month.  Thanks MJP

## 2024-01-08 ENCOUNTER — Other Ambulatory Visit: Payer: Self-pay | Admitting: Cardiology

## 2024-01-08 DIAGNOSIS — I25118 Atherosclerotic heart disease of native coronary artery with other forms of angina pectoris: Secondary | ICD-10-CM

## 2024-01-13 ENCOUNTER — Ambulatory Visit (HOSPITAL_COMMUNITY)
Admission: RE | Admit: 2024-01-13 | Discharge: 2024-01-13 | Disposition: A | Discharge status: Home / Self Care | Source: Ambulatory Visit | Attending: Cardiology | Admitting: Cardiology

## 2024-01-13 DIAGNOSIS — I25118 Atherosclerotic heart disease of native coronary artery with other forms of angina pectoris: Secondary | ICD-10-CM | POA: Diagnosis not present

## 2024-01-13 MED ORDER — TECHNETIUM TC 99M TETROFOSMIN IV KIT
32.5000 | PACK | Freq: Once | INTRAVENOUS | Status: AC | PRN
  Administered 2024-01-13: 32.5 via INTRAVENOUS

## 2024-01-13 MED ORDER — TECHNETIUM TC 99M TETROFOSMIN IV KIT
10.9000 | PACK | Freq: Once | INTRAVENOUS | Status: AC | PRN
  Administered 2024-01-13: 10.9 via INTRAVENOUS

## 2024-01-14 ENCOUNTER — Ambulatory Visit: Admitting: Cardiology

## 2024-01-14 LAB — MYOCARDIAL PERFUSION IMAGING
Angina Index: 1
Duke Treadmill Score: 3
Estimated workload: 8.2
Exercise duration (min): 7 min
Exercise duration (sec): 1 s
LV dias vol: 78 mL (ref 62–150)
LV sys vol: 33 mL (ref 4.2–5.8)
MPHR: 169 {beats}/min
Nuc Stress EF: 58 %
Peak HR: 148 {beats}/min
Percent HR: 87 %
Rest HR: 81 {beats}/min
Rest Nuclear Isotope Dose: 10.9 mCi
SDS: 8
SRS: 0
SSS: 8
ST Depression (mm): 0 mm
Stress Nuclear Isotope Dose: 32.5 mCi
TID: 1.23

## 2024-01-14 NOTE — Progress Notes (Signed)
 Stress test shows reduced circulation to part of the heart muscle due to heart artery narrowing.  In addition to regular compliance with medications, I do recommend heart catheterization to evaluate heart arteries and consider if stenting would be necessary.  If patient is agreeable with the discussion below, please schedule for heart catheterization at mutually suitable time.  He will need CBC and BMP if not checked recently.  I have reviewed the risks, indications, and alternatives to cardiac catheterization, possible angioplasty, and stenting with the patient. Risks include but are not limited to bleeding, infection, vascular injury, stroke, myocardial infection, arrhythmia, kidney injury, radiation-related injury in the case of prolonged fluoroscopy use, emergency cardiac surgery, and death. The patient understands the risks of serious complication is 1-2 in 1000 with diagnostic cardiac cath and 1-2% or less with angioplasty/stenting.   Thanks MJP

## 2024-01-20 ENCOUNTER — Ambulatory Visit: Attending: Cardiology | Admitting: Cardiology

## 2024-01-20 ENCOUNTER — Encounter: Payer: Self-pay | Admitting: Cardiology

## 2024-01-20 ENCOUNTER — Other Ambulatory Visit (HOSPITAL_COMMUNITY): Payer: Self-pay

## 2024-01-20 VITALS — BP 134/81 | HR 92 | Ht 66.0 in | Wt 147.0 lb

## 2024-01-20 DIAGNOSIS — R7989 Other specified abnormal findings of blood chemistry: Secondary | ICD-10-CM | POA: Insufficient documentation

## 2024-01-20 DIAGNOSIS — E118 Type 2 diabetes mellitus with unspecified complications: Secondary | ICD-10-CM

## 2024-01-20 DIAGNOSIS — E782 Mixed hyperlipidemia: Secondary | ICD-10-CM

## 2024-01-20 DIAGNOSIS — I25118 Atherosclerotic heart disease of native coronary artery with other forms of angina pectoris: Secondary | ICD-10-CM

## 2024-01-20 MED ORDER — METOPROLOL SUCCINATE ER 25 MG PO TB24: 25.0000 mg | ORAL_TABLET | Freq: Every day | ORAL | 3 refills | Status: AC

## 2024-01-20 MED ORDER — ICOSAPENT ETHYL 1 G PO CAPS: 2.0000 g | ORAL_CAPSULE | Freq: Two times a day (BID) | ORAL | 3 refills | Status: AC

## 2024-01-20 MED ORDER — METFORMIN HCL 500 MG PO TABS: 500.0000 mg | ORAL_TABLET | Freq: Two times a day (BID) | ORAL | 3 refills | Status: AC

## 2024-01-20 MED ORDER — EMPAGLIFLOZIN 25 MG PO TABS: ORAL_TABLET | ORAL | 3 refills | Status: AC

## 2024-01-20 MED ORDER — ISOSORBIDE MONONITRATE ER 30 MG PO TB24: 30.0000 mg | ORAL_TABLET | Freq: Every day | ORAL | 3 refills | Status: AC

## 2024-01-20 MED ORDER — ROSUVASTATIN CALCIUM 20 MG PO TABS: 20.0000 mg | ORAL_TABLET | Freq: Every day | ORAL | 3 refills | Status: AC

## 2024-01-20 MED ORDER — NITROGLYCERIN 0.4 MG SL SUBL: 0.4000 mg | SUBLINGUAL_TABLET | SUBLINGUAL | 2 refills | Status: AC | PRN

## 2024-01-20 MED ORDER — METFORMIN HCL 500 MG PO TABS
500.0000 mg | ORAL_TABLET | Freq: Two times a day (BID) | ORAL | 0 refills | Status: DC
  Filled 2024-01-20: qty 60, 30d supply, fill #0

## 2024-01-20 NOTE — Patient Instructions (Signed)
 Medication Instructions:  INCREASE Metformin  to twice daily  (needs to be controlled by PCP)  *If you need a refill on your cardiac medications before your next appointment, please call your pharmacy*  Lab Work IN 6 MONTHS : LIPID PANEL  LFT  If you have labs (blood work) drawn today and your tests are completely normal, you will receive your results only by: MyChart Message (if you have MyChart) OR A paper copy in the mail If you have any lab test that is abnormal or we need to change your treatment, we will call you to review the results.   REFERRAL TO GASTROENTEROLOGY   Follow-Up: At [facility], you and your health needs are our priority.  As part of our continuing mission to provide you with exceptional heart care, our providers are all part of one team.  This team includes your primary Cardiologist (physician) and Advanced Practice Providers or APPs (Physician Assistants and Nurse Practitioners) who all work together to provide you with the care you need, when you need it.  Your next appointment:   6 month(s)  Provider:   Newman JINNY Lawrence, MD

## 2024-01-20 NOTE — Progress Notes (Signed)
 Cardiology Office Note:  .   Date:  01/20/2024  ID:  Peter Warren, DOB [birth year], MRN [id number] PCP: Patient, No Pcp Per  Peter Warren Providers Cardiologist:  Peter Lawrence, MD PCP: Patient, No Pcp Per  Chief Complaint  Patient presents with   Coronary Artery Disease     Peter Warren is a 53 y.o. male with type II diabetes mellitus, hypertriglyceridemia, single vessel coronary artery disease with ostial/prox LAD > 90% stenosis (CCTA 2019)    History of Present Illness  I have been seeing the patient since 2019 when he had first and complaints of chest pain.  Coronary CT angiogram at that time hide showed proximal LAD stenosis >90%.  However, patient had opted for medical management and did not want to undergo cardiac catheterization.  He also met several changes to his diet.  He did surprisingly well ever since with medical management.  Stress test in 01/2022 had showed mild ischemia, with mild reduced stress LVEF.  Again he had opted for medical management.  Patient is here for follow-up today.  Following stress test.  Discussed results of stress test with the patient, digestible.  Patient continues to limit his physical activity, but still has occasional chest pain and shortness of breath symptoms.  Reviewed lab results with the patient, latest    Vitals:   01/20/24 1139  BP: 134/81  Pulse: 92  SpO2: 98%      Review of Systems  Constitutional: Positive for malaise/fatigue.  Cardiovascular:  Positive for chest pain. Negative for dyspnea on exertion, leg swelling, palpitations and syncope.        Studies Reviewed: SABRA        EKG 01/02/2024: Normal sinus rhythm Normal ECG When compared with ECG of 25-Dec-2022 08:10, T wave inversion now evident in Inferior leads  Stress test 12/2023:   Findings are consistent with medium size, moderate reversible defect in the mid-apical septum consistent with ischemia. The study is intermediate risk.   A Bruce  protocol stress test was performed. Exercise capacity was normal. Patient exercised for 7 min and 1 sec. Maximum HR of 148 bpm. MPHR 87.0%. Peak METS 8.2. The patient experienced non-limiting angina during the test. The test was stopped because the patient experienced dyspnea. The patient reported dyspnea, fatigue and chest pain during the stress test. Normal blood pressure and normal heart rate response noted during stress. Heart rate recovery was normal.   The ECG was negative for ischemia.   LV perfusion is abnormal. There is evidence of ischemia. There is no evidence of infarction. Defect 1: There is a medium defect with moderate reduction in uptake present in the apical to mid anteroseptal and apex location(s) that is reversible. There is abnormal wall motion in the defect area. Consistent with ischemia.   Left ventricular function is normal. Nuclear stress EF: 58%. The left ventricular ejection fraction is normal (55-65%). End diastolic cavity size is normal. End systolic cavity size is normal. Evidence of transient ischemic dilation (TID) noted. TID was appreciated visually and quantitatively.   CT images were obtained for attenuation correction and were examined for the presence of coronary calcium  when appropriate.   Coronary calcium  was present on the attenuation correction CT images. Severe coronary calcifications were present. Coronary calcifications were present in the left anterior descending artery, left circumflex artery and right coronary artery distribution(s).   Prior study available for comparison from 02/13/2022. No significant changes compared to prior study, however comparison made to report only.  CCTA 2019: 1. Coronary artery calcium  score 13.2 Agatston units. This places the patient in the 84th percentile for age and gender. 2. There appears to be severe (>90%) proximal LAD stenosis.  Labs 12/2023: Chol 152, TG 212, HDL 36, LDL 80 HbA1C 6.4% Cr 0.89, AST/ALT  51/58  Labs 12/2022: Chol 149, TG 234, HDL 39, LDL 72 HbA1C 6.0% Hb 13.2 Cr 0.8   Physical Exam Vitals and nursing note reviewed.  Constitutional:      General: He is not in acute distress. Neck:     Vascular: No JVD.  Cardiovascular:     Rate and Rhythm: Normal rate and regular rhythm.     Heart sounds: Normal heart sounds. No murmur heard. Pulmonary:     Effort: Pulmonary effort is normal.     Breath sounds: Normal breath sounds. No wheezing or rales.  Musculoskeletal:     Right lower leg: No edema.     Left lower leg: No edema.      VISIT DIAGNOSES:   ICD-10-CM   1. Elevated LFTs  R79.89 Ambulatory referral to Gastroenterology    Hepatic function panel    Hepatic function panel    2. Coronary artery disease of native artery of native heart with stable angina pectoris  I25.118 empagliflozin  (JARDIANCE ) 25 MG TABS tablet    icosapent  Ethyl (VASCEPA ) 1 g capsule    nitroGLYCERIN  (NITROSTAT ) 0.4 MG SL tablet    3. Mixed hyperlipidemia  E78.2 Lipid panel    Lipid panel    4. Type 2 diabetes mellitus with complication, without long-term current use of insulin (HCC)  E11.8 empagliflozin  (JARDIANCE ) 25 MG TABS tablet        Peter Warren is a 53 y.o. male with type II diabetes mellitus, hypertriglyceridemia, single vessel coronary artery disease with ostial/prox LAD > 90% stenosis (CCTA 2019)   Assessment & Plan  CAD; Proximal LAD 95% stenosis diagnosed on coronary CT angiogram (2019) Intermediate risk stress test (12/2023): Again discussed importance of diet and lifestyle modification, being compliant with medical therapy.  In addition, I again recommended cardiac catheterization.  Patient still would like to avoid cardiac catheterization as much as possible.  He understands importance of compliance with medical therapy. Continue aspirin  81 mg daily, metoprolol  succinate 25 mg daily, Imdur  30 mg daily. He is taking Jardiance  one third to half tablet of 25 mg  daily.  He is not keen on 10 mg daily Due to cost reasons.  He is going to take half tablet of Jardiance  10 mg daily.  Mixed hyperlipidemia: Discussed importance of avoiding simple carbohydrate and high saturated fat diet.   Continue Crestor  20 mg daily and Vascepa  1 mg 3 times daily. Repeat fasting lipid panel in 6 months.  Type II DM: Not taking metformin  regularly.  A1c has increased to 6.4%.  Emphasize importance of taking metformin  regularly and increase to 500 mg twice daily.  Elevated liver enzymes: Consistently elevated liver enzymes.  Therefore, unable to increase Crestor  dose. Refer to GI for further workup.  Again, emphasized the importance of establishing care with a primary care provider.   Meds ordered this encounter  Medications   DISCONTD: metFORMIN  (GLUCOPHAGE ) 500 MG tablet    Sig: Take 1 tablet (500 mg total) by mouth 2 (two) times daily with a meal.    Dispense:  60 tablet    Refill:  0    Pt must keep upcoming followup appt with Cardiology in October 2025 for any more refills. Thank You  metFORMIN  (GLUCOPHAGE ) 500 MG tablet    Sig: Take 1 tablet (500 mg total) by mouth 2 (two) times daily with a meal.    Dispense:  180 tablet    Refill:  3   empagliflozin  (JARDIANCE ) 25 MG TABS tablet    Sig: TAKE 0.5 (12.5 MG) TABLET DAILY    Dispense:  45 tablet    Refill:  3   isosorbide  mononitrate (IMDUR ) 30 MG 24 hr tablet    Sig: Take 1 tablet (30 mg total) by mouth daily.    Dispense:  90 tablet    Refill:  3   icosapent  Ethyl (VASCEPA ) 1 g capsule    Sig: Take 2 capsules (2 g total) by mouth 2 (two) times daily.    Dispense:  360 capsule    Refill:  3   metoprolol  succinate (TOPROL -XL) 25 MG 24 hr tablet    Sig: Take 1 tablet (25 mg total) by mouth daily.    Dispense:  90 tablet    Refill:  3   nitroGLYCERIN  (NITROSTAT ) 0.4 MG SL tablet    Sig: Place 1 tablet (0.4 mg total) under the tongue every 5 (five) minutes as needed for chest pain.    Dispense:  25  tablet    Refill:  2   rosuvastatin  (CRESTOR ) 20 MG tablet    Sig: Take 1 tablet (20 mg total) by mouth daily.    Dispense:  90 tablet    Refill:  3     F/u in 6 months  Signed, Peter JINNY Lawrence, MD

## 2024-02-05 ENCOUNTER — Telehealth: Payer: Self-pay | Admitting: Pharmacy Technician

## 2024-02-05 NOTE — Telephone Encounter (Signed)
   Pharmacy Patient Advocate Encounter   Received notification from CoverMyMeds that prior authorization for vascepa  is required/requested.   Insurance verification completed.   The patient is insured through [employer].   Per test claim: PA required; PA submitted to above mentioned insurance via Latent Key/confirmation #/EOC [id number] Status is pending

## 2024-02-05 NOTE — Telephone Encounter (Signed)
 Pharmacy Patient Advocate Encounter  Received notification from [employer] that Prior Authorization for vascepa  has been APPROVED from 02/05/24 to 02/04/25   PA #/Case ID/Reference #: [id number]

## 2024-04-13 ENCOUNTER — Ambulatory Visit: Admitting: Cardiology

## 2024-08-03 ENCOUNTER — Ambulatory Visit: Admitting: Cardiology
